# Patient Record
Sex: Female | Born: 1985 | Race: Black or African American | Hispanic: No | Marital: Married | Smoking: Never smoker
Health system: Southern US, Community
[De-identification: ages and names within clinical notes are randomized; demographics above are authoritative.]

## PROBLEM LIST (undated history)

## (undated) DIAGNOSIS — Z789 Other specified health status: Secondary | ICD-10-CM

## (undated) DIAGNOSIS — O24419 Gestational diabetes mellitus in pregnancy, unspecified control: Secondary | ICD-10-CM

## (undated) HISTORY — PX: NO PAST SURGERIES: SHX2092

## (undated) HISTORY — DX: Gestational diabetes mellitus in pregnancy, unspecified control: O24.419

---
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
## 2010-03-01 ENCOUNTER — Ambulatory Visit (HOSPITAL_COMMUNITY)
Admission: RE | Admit: 2010-03-01 | Discharge: 2010-03-01 | Payer: Self-pay | Source: Home / Self Care | Attending: Obstetrics | Admitting: Obstetrics

## 2010-04-03 ENCOUNTER — Other Ambulatory Visit (HOSPITAL_COMMUNITY): Payer: Self-pay | Admitting: Obstetrics

## 2010-04-03 DIAGNOSIS — Z3400 Encounter for supervision of normal first pregnancy, unspecified trimester: Secondary | ICD-10-CM

## 2010-04-19 ENCOUNTER — Ambulatory Visit (HOSPITAL_COMMUNITY)
Admission: RE | Admit: 2010-04-19 | Discharge: 2010-04-19 | Disposition: A | Discharge status: Home / Self Care | Payer: Managed Care, Other (non HMO) | Source: Ambulatory Visit | Attending: Obstetrics | Admitting: Obstetrics

## 2010-04-19 DIAGNOSIS — Z1389 Encounter for screening for other disorder: Secondary | ICD-10-CM | POA: Insufficient documentation

## 2010-04-19 DIAGNOSIS — Z3400 Encounter for supervision of normal first pregnancy, unspecified trimester: Secondary | ICD-10-CM

## 2010-04-19 DIAGNOSIS — O358XX0 Maternal care for other (suspected) fetal abnormality and damage, not applicable or unspecified: Secondary | ICD-10-CM | POA: Insufficient documentation

## 2010-04-19 DIAGNOSIS — Z363 Encounter for antenatal screening for malformations: Secondary | ICD-10-CM | POA: Insufficient documentation

## 2010-06-13 ENCOUNTER — Other Ambulatory Visit (HOSPITAL_COMMUNITY): Payer: Self-pay | Admitting: Obstetrics

## 2010-06-13 DIAGNOSIS — Z09 Encounter for follow-up examination after completed treatment for conditions other than malignant neoplasm: Secondary | ICD-10-CM

## 2010-06-17 ENCOUNTER — Other Ambulatory Visit (HOSPITAL_COMMUNITY): Payer: Self-pay | Admitting: Obstetrics

## 2010-06-17 ENCOUNTER — Ambulatory Visit (HOSPITAL_COMMUNITY)
Admission: RE | Admit: 2010-06-17 | Discharge: 2010-06-17 | Disposition: A | Discharge status: Home / Self Care | Payer: Managed Care, Other (non HMO) | Source: Ambulatory Visit | Attending: Obstetrics | Admitting: Obstetrics

## 2010-06-17 DIAGNOSIS — O365990 Maternal care for other known or suspected poor fetal growth, unspecified trimester, not applicable or unspecified: Secondary | ICD-10-CM | POA: Insufficient documentation

## 2010-06-17 DIAGNOSIS — Z09 Encounter for follow-up examination after completed treatment for conditions other than malignant neoplasm: Secondary | ICD-10-CM

## 2010-06-17 DIAGNOSIS — Z3689 Encounter for other specified antenatal screening: Secondary | ICD-10-CM | POA: Insufficient documentation

## 2010-06-20 ENCOUNTER — Other Ambulatory Visit (HOSPITAL_COMMUNITY): Payer: Self-pay | Admitting: Obstetrics

## 2010-06-20 DIAGNOSIS — Z1389 Encounter for screening for other disorder: Secondary | ICD-10-CM

## 2010-07-18 ENCOUNTER — Ambulatory Visit (HOSPITAL_COMMUNITY): Payer: Managed Care, Other (non HMO)

## 2010-08-15 ENCOUNTER — Inpatient Hospital Stay (HOSPITAL_COMMUNITY)
Admission: AD | Admit: 2010-08-15 | Discharge: 2010-08-17 | DRG: 775 | Disposition: A | Discharge status: Home / Self Care | Payer: Managed Care, Other (non HMO) | Source: Ambulatory Visit | Attending: Obstetrics | Admitting: Obstetrics

## 2010-08-15 ENCOUNTER — Institutional Professional Consult (permissible substitution): Payer: Managed Care, Other (non HMO) | Admitting: Pediatrics

## 2010-08-15 DIAGNOSIS — O4290 Premature rupture of membranes, unspecified as to length of time between rupture and onset of labor, unspecified weeks of gestation: Principal | ICD-10-CM | POA: Diagnosis present

## 2010-08-15 LAB — CBC
HCT: 34.8 % — ABNORMAL LOW (ref 36.0–46.0)
Hemoglobin: 11.7 g/dL — ABNORMAL LOW (ref 12.0–15.0)
MCH: 29.4 pg (ref 26.0–34.0)
MCV: 87.4 fL (ref 78.0–100.0)
RBC: 3.98 MIL/uL (ref 3.87–5.11)

## 2010-08-15 LAB — RPR: RPR Ser Ql: NONREACTIVE

## 2010-08-16 LAB — CBC
HCT: 32.3 % — ABNORMAL LOW (ref 36.0–46.0)
Hemoglobin: 10.6 g/dL — ABNORMAL LOW (ref 12.0–15.0)
MCHC: 32.8 g/dL (ref 30.0–36.0)
MCV: 87.5 fL (ref 78.0–100.0)

## 2010-08-16 MED ORDER — HYDROCORTISONE ACE-PRAMOXINE 1-1 % RE FOAM: 1.0000 | RECTAL | Status: DC | PRN

## 2010-08-16 MED ORDER — MENTHOL 3 MG MT LOZG: 1.0000 | LOZENGE | OROMUCOSAL | Status: DC | PRN

## 2010-08-16 MED ORDER — SODIUM CHLORIDE 0.9 % IJ SOLN
3.0000 mL | INTRAMUSCULAR | Status: DC | PRN
  Filled 2010-08-16: qty 3

## 2010-08-16 MED ORDER — DIPHENHYDRAMINE HCL 25 MG PO CAPS: 25.0000 mg | ORAL_CAPSULE | Freq: Four times a day (QID) | ORAL | Status: DC | PRN

## 2010-08-16 MED ORDER — GUAIFENESIN 100 MG/5ML PO SOLN: 15.0000 mL | ORAL | Status: DC | PRN

## 2010-08-16 MED ORDER — OXYCODONE-ACETAMINOPHEN 5-325 MG PO TABS: 1.0000 | ORAL_TABLET | ORAL | Status: DC | PRN

## 2010-08-16 MED ORDER — DIBUCAINE 1 % RE OINT: TOPICAL_OINTMENT | RECTAL | Status: DC | PRN

## 2010-08-16 MED ORDER — FLEET ENEMA 7-19 GM/118ML RE ENEM
1.0000 | ENEMA | Freq: Every day | RECTAL | Status: DC | PRN
  Filled 2010-08-16: qty 1

## 2010-08-16 MED ORDER — ZOLPIDEM TARTRATE 5 MG PO TABS: 5.0000 mg | ORAL_TABLET | Freq: Every evening | ORAL | Status: DC | PRN

## 2010-08-16 MED ORDER — ONDANSETRON HCL 4 MG PO TABS: 4.0000 mg | ORAL_TABLET | ORAL | Status: DC | PRN

## 2010-08-16 MED ORDER — WITCH HAZEL-GLYCERIN EX PADS
MEDICATED_PAD | CUTANEOUS | Status: DC | PRN
  Filled 2010-08-16: qty 100

## 2010-08-16 MED ORDER — SENNOSIDES-DOCUSATE SODIUM 8.6-50 MG PO TABS
1.0000 | ORAL_TABLET | Freq: Every day | ORAL | Status: DC
  Filled 2010-08-16: qty 2

## 2010-08-16 MED ORDER — BENZOCAINE-MENTHOL 20-0.5 % EX AERO: 1.0000 "application " | INHALATION_SPRAY | Freq: Four times a day (QID) | CUTANEOUS | Status: DC | PRN

## 2010-08-16 MED ORDER — OXYTOCIN 20 UNITS IN LACTATED RINGERS INFUSION - SIMPLE: 125.0000 mL/h | INTRAVENOUS | Status: DC

## 2010-08-16 MED ORDER — CALCIUM CARBONATE ANTACID 500 MG PO CHEW: 1.0000 | CHEWABLE_TABLET | ORAL | Status: DC | PRN

## 2010-08-16 MED ORDER — ONDANSETRON HCL 4 MG/2ML IJ SOLN: 4.0000 mg | INTRAMUSCULAR | Status: DC | PRN

## 2010-08-16 MED ORDER — BISACODYL 10 MG RE SUPP: 10.0000 mg | Freq: Every day | RECTAL | Status: DC | PRN

## 2010-08-16 MED ORDER — SIMETHICONE 80 MG PO CHEW
80.0000 mg | CHEWABLE_TABLET | ORAL | Status: DC | PRN
  Filled 2010-08-16: qty 1

## 2010-08-16 MED ORDER — PRENATAL PLUS 27-1 MG PO TABS
1.0000 | ORAL_TABLET | Freq: Every day | ORAL | Status: DC
Start: 2010-08-17 — End: 2010-08-17
  Filled 2010-08-16: qty 1

## 2010-08-16 MED ORDER — MAGNESIUM HYDROXIDE 400 MG/5ML PO SUSP: 30.0000 mL | ORAL | Status: DC

## 2010-08-16 MED ORDER — IBUPROFEN 600 MG PO TABS
600.0000 mg | ORAL_TABLET | Freq: Four times a day (QID) | ORAL | Status: DC
  Administered 2010-08-17: 600 mg via ORAL

## 2010-08-16 MED ORDER — SODIUM CHLORIDE 0.9 % IJ SOLN
3.0000 mL | Freq: Two times a day (BID) | INTRAMUSCULAR | Status: DC
  Filled 2010-08-16 (×3): qty 3

## 2010-08-16 MED ORDER — PSEUDOEPHEDRINE HCL 30 MG PO TABS: 60.0000 mg | ORAL_TABLET | ORAL | Status: DC | PRN

## 2010-08-17 ENCOUNTER — Encounter (HOSPITAL_COMMUNITY): Payer: Self-pay | Admitting: Obstetrics

## 2010-08-17 MED ORDER — BENZOCAINE-MENTHOL 20-0.5 % EX AERO: 1.0000 "application " | INHALATION_SPRAY | Freq: Four times a day (QID) | CUTANEOUS | Status: AC | PRN

## 2010-08-17 MED ORDER — ACETAMINOPHEN-CODEINE 300-30 MG PO TABS: 1.0000 | ORAL_TABLET | ORAL | Status: AC | PRN

## 2010-08-17 MED ORDER — BISACODYL 10 MG RE SUPP
10.0000 mg | Freq: Every day | RECTAL | Status: AC | PRN
Start: 2010-08-17 — End: 2010-08-27

## 2010-08-17 NOTE — Progress Notes (Deleted)
  Obstetric Discharge Summary  Reason for Admission: onset of labor  Prenatal Procedures: none  Intrapartum Procedures: spontaneous vaginal delivery  Postpartum Procedures: none  Complications-Operative and Postpartum: none  Hemoglobin   Date  Value  Range  Status   08/16/2010  10.6*  12.0-15.0 (g/dL)  Final      HCT   Date  Value  Range  Status   08/16/2010  32.3*  36.0-46.0 (%)  Final    Discharge Diagnoses: Term Pregnancy-delivered  Discharge Information:  Date: 08/17/2010  Activity: pelvic rest  Diet: routine  Medications: Tylenol #3  Condition: stable  Instructions: refer to practice specific booklet  Discharge to: home  Follow-up Information    Follow up with Isaiha Asare A. Call in 6 weeks.    Contact information:    [address]  [address]  [place] [address]  [phone]         Newborn Data: Live born  Information for the patient's newborn:  Batt, Girl Remee [[id number]]   female  ; APGAR , ; weight ;  Home with mother.  Chrislynn Mosely A  08/17/2010, 7:45 AM  

## 2010-08-17 NOTE — Discharge Summary (Signed)
  Obstetric Discharge Summary  Reason for Admission: onset of labor  Prenatal Procedures: none  Intrapartum Procedures: spontaneous vaginal delivery  Postpartum Procedures: none  Complications-Operative and Postpartum: none  Hemoglobin   Date  Value  Range  Status   08/16/2010  10.6*  12.0-15.0 (g/dL)  Final      HCT   Date  Value  Range  Status   08/16/2010  32.3*  36.0-46.0 (%)  Final    Discharge Diagnoses: Term Pregnancy-delivered  Discharge Information:  Date: 08/17/2010  Activity: pelvic rest  Diet: routine  Medications: Tylenol #3  Condition: stable  Instructions: refer to practice specific booklet  Discharge to: home  Follow-up Information    Follow up with Mikale Silversmith A. Call in 6 weeks.    Contact information:    [address]  [address]  [place] [address]  [phone]         Newborn Data: Live born  Information for the patient's newborn:  Katanya, Schlie [[id number]]   female  ; APGAR , ; weight ;  Home with mother.  Layah Skousen A  08/17/2010, 7:45 AM

## 2010-08-25 NOTE — Discharge Summary (Signed)
Obstetric Discharge Summary Reason for Admission: onset of labor Prenatal Procedures: none Intrapartum Procedures: spontaneous vaginal delivery Postpartum Procedures: none Complications-Operative and Postpartum: none  Hemoglobin  Date Value Range Status  08/16/2010 10.6* 12.0-15.0 (g/dL) Final     HCT  Date Value Range Status  08/16/2010 32.3* 36.0-46.0 (%) Final    Discharge Diagnoses: Term Pregnancy-delivered  Discharge Information: Date: 08/25/2010 Activity: pelvic rest Diet: routine Medications: Tylenol #3 Condition: stable Instructions: refer to practice specific booklet Discharge to: home Follow-up Information    Follow up with [name] Call in 6 weeks.   Contact information:   [address] [place] [address] [phone]          Newborn Data: Live born  Information for the patient's newborn:  [name], [name] [[id number]]  female ; APGAR , ; weight ;  Home with mother.  [name] 08/25/2010, 2:58 AM

## 2010-09-01 ENCOUNTER — Inpatient Hospital Stay (HOSPITAL_COMMUNITY)
Admission: AD | Admit: 2010-09-01 | Payer: Managed Care, Other (non HMO) | Source: Ambulatory Visit | Admitting: Obstetrics

## 2011-05-11 LAB — OB RESULTS CONSOLE GBS: GBS: NEGATIVE

## 2011-07-14 ENCOUNTER — Inpatient Hospital Stay (HOSPITAL_COMMUNITY)
Admission: AD | Admit: 2011-07-14 | Discharge: 2011-07-14 | Disposition: A | Discharge status: Hospice | Payer: BC Managed Care – PPO | Source: Ambulatory Visit | Attending: Obstetrics | Admitting: Obstetrics

## 2011-07-14 DIAGNOSIS — N390 Urinary tract infection, site not specified: Secondary | ICD-10-CM

## 2011-07-14 DIAGNOSIS — R350 Frequency of micturition: Secondary | ICD-10-CM | POA: Insufficient documentation

## 2011-07-14 DIAGNOSIS — R300 Dysuria: Secondary | ICD-10-CM | POA: Insufficient documentation

## 2011-07-14 LAB — URINALYSIS, ROUTINE W REFLEX MICROSCOPIC
Bilirubin Urine: NEGATIVE
Glucose, UA: NEGATIVE mg/dL
Ketones, ur: NEGATIVE mg/dL
Nitrite: NEGATIVE
Protein, ur: 100 mg/dL — AB
Specific Gravity, Urine: 1.015 (ref 1.005–1.030)
Urobilinogen, UA: 0.2 mg/dL (ref 0.0–1.0)
pH: 5.5 (ref 5.0–8.0)

## 2011-07-14 LAB — URINE MICROSCOPIC-ADD ON

## 2011-07-14 MED ORDER — SULFAMETHOXAZOLE-TRIMETHOPRIM 800-160 MG PO TABS: 1.0000 | ORAL_TABLET | Freq: Two times a day (BID) | ORAL | Status: DC

## 2011-07-14 MED ORDER — FLUCONAZOLE 150 MG PO TABS: 150.0000 mg | ORAL_TABLET | Freq: Once | ORAL | Status: AC

## 2011-07-14 MED ORDER — SULFAMETHOXAZOLE-TRIMETHOPRIM 800-160 MG PO TABS: 1.0000 | ORAL_TABLET | Freq: Two times a day (BID) | ORAL | Status: AC

## 2011-07-14 MED ORDER — FLUCONAZOLE 150 MG PO TABS: 150.0000 mg | ORAL_TABLET | Freq: Once | ORAL | Status: DC

## 2011-07-14 NOTE — MAU Note (Signed)
Pt reports pain, burning and frequency with urination that started Thursday, self medicated with AZO.  Today woke up with shakes and dizziness.  LMP 07/01/2011.  G1 P1

## 2011-07-14 NOTE — Discharge Instructions (Signed)

## 2011-07-14 NOTE — MAU Provider Note (Signed)
History     CSN: [id number]  Arrival date and time: 07/14/11 2134   None     Chief Complaint  Patient presents with  . Urinary Tract Infection  . Shaking  . Dizziness   HPI 26 year old female with onset of frequency and dysuria that started last Thursday. Symptoms have been getting worse over the past couple of days. Patient has tried over-the-counter remedies including cranberry juice, water, which were mildly helpful.  no provoking factors.   OB History    Grav Para Term Preterm Abortions TAB SAB Ect Mult Living                  Past Medical History  Diagnosis Date  . Spontaneous vaginal delivery 08/17/2010    No past surgical history on file.  No family history on file.  History  Substance Use Topics  . Smoking status: Not on file  . Smokeless tobacco: Not on file  . Alcohol Use: Not on file    Allergies: No Known Allergies  Prescriptions prior to admission  Medication Sig Dispense Refill  . Acetaminophen-Codeine (TYLENOL/CODEINE #3) 300-30 MG per tablet Take 1 tablet by mouth every 4 (four) hours as needed for pain.  40 tablet  0  . simethicone (MYLICON) 80 MG chewable tablet Chew 80 mg by mouth every 6 (six) hours as needed.        Review of Systems  Constitutional: Negative for fever and chills.  Gastrointestinal: Negative for nausea, vomiting, diarrhea and constipation.   Physical Exam   Blood pressure 113/70, pulse 140, temperature 102.3 F (39.1 C), temperature source Oral, resp. rate 18, height 5\' 2"  (1.575 m), weight 53.252 kg (117 lb 6.4 oz), last menstrual period 07/01/2011, SpO2 98.00%.  Physical Exam  Constitutional: She is oriented to person, place, and time. She appears well-developed.  HENT:  Head: Normocephalic and atraumatic.  GI: Soft. She exhibits no distension and no mass. There is tenderness (Suprapubic). There is no rebound and no guarding.  Musculoskeletal:       No CVA tenderness  Neurological: She is alert and oriented to  person, place, and time.  Skin: Skin is warm and dry.  Psychiatric: She has a normal mood and affect. Her behavior is normal. Judgment and thought content normal.   Results for orders placed during the hospital encounter of 07/14/11 (from the past 24 hour(s))  URINALYSIS, ROUTINE W REFLEX MICROSCOPIC     Status: Abnormal   Collection Time   07/14/11  9:52 PM      Component Value Range   Color, Urine YELLOW  YELLOW    APPearance CLEAR  CLEAR    Specific Gravity, Urine 1.015  1.005 - 1.030    pH 5.5  5.0 - 8.0    Glucose, UA NEGATIVE  NEGATIVE (mg/dL)   Hgb urine dipstick LARGE (*) NEGATIVE    Bilirubin Urine NEGATIVE  NEGATIVE    Ketones, ur NEGATIVE  NEGATIVE (mg/dL)   Protein, ur 409 (*) NEGATIVE (mg/dL)   Urobilinogen, UA 0.2  0.0 - 1.0 (mg/dL)   Nitrite NEGATIVE  NEGATIVE    Leukocytes, UA LARGE (*) NEGATIVE   URINE MICROSCOPIC-ADD ON     Status: Abnormal   Collection Time   07/14/11  9:52 PM      Component Value Range   Squamous Epithelial / LPF FEW (*) RARE    WBC, UA TOO NUMEROUS TO COUNT  <3 (WBC/hpf)   RBC / HPF 3-6  <3 (RBC/hpf)   Bacteria,  UA FEW (*) RARE    Urine-Other MUCOUS PRESENT    POCT PREGNANCY, URINE     Status: Normal   Collection Time   07/14/11 10:01 PM      Component Value Range   Preg Test, Ur NEGATIVE  NEGATIVE      MAU Course  Procedures  MDM No evidence of pyelonephritis.  Assessment and Plan  #1 urinary tract infection  Bactrim twice a day for 3 days. Diflucan for yeast infection prophylaxis. Patient to followup with PCP if symptoms worsen  Avalee Castrellon JEHIEL 07/14/2011, 10:34 PM

## 2011-07-14 NOTE — MAU Note (Signed)
Pt started with lower abd pain on May 30th, pt self medicated with AZO for two days with minimal relief.  Yesterday pt started with fever and it worsened today 102.3.  Mild left flank pain, nontender unless palpated.

## 2011-10-28 LAB — OB RESULTS CONSOLE HEPATITIS B SURFACE ANTIGEN: Hepatitis B Surface Ag: NEGATIVE

## 2011-10-28 LAB — OB RESULTS CONSOLE ABO/RH: RH Type: POSITIVE

## 2011-10-28 LAB — OB RESULTS CONSOLE RUBELLA ANTIBODY, IGM: Rubella: IMMUNE

## 2011-10-28 LAB — OB RESULTS CONSOLE ANTIBODY SCREEN: Antibody Screen: NEGATIVE

## 2011-10-28 LAB — OB RESULTS CONSOLE HIV ANTIBODY (ROUTINE TESTING): HIV: NONREACTIVE

## 2011-11-21 LAB — OB RESULTS CONSOLE GC/CHLAMYDIA
Chlamydia: NEGATIVE
Gonorrhea: NEGATIVE

## 2012-01-25 LAB — OB RESULTS CONSOLE GC/CHLAMYDIA: Chlamydia: NEGATIVE

## 2012-01-25 LAB — OB RESULTS CONSOLE ANTIBODY SCREEN: Antibody Screen: NEGATIVE

## 2012-02-11 NOTE — L&D Delivery Note (Signed)
Delivery Note At 6:21 AM a viable female was delivered via Vaginal, Spontaneous Delivery Apgars pending  Placenta status: spontaneously with intact cord   Cord:  with the following complications: none  Anesthesia: Epidural  Episiotomy: none  Lacerationsfirst:  Suture Repair: 3.0 chromic Est. Blood Loss (mL): 300  Mom to postpartum.  Baby to nursery-stable.  Lyndon Chenoweth L 05/18/2012, 6:27 AM

## 2012-05-18 ENCOUNTER — Encounter (HOSPITAL_COMMUNITY): Payer: Self-pay | Admitting: Anesthesiology

## 2012-05-18 ENCOUNTER — Encounter (HOSPITAL_COMMUNITY): Payer: Self-pay | Admitting: *Deleted

## 2012-05-18 ENCOUNTER — Inpatient Hospital Stay (HOSPITAL_COMMUNITY): Payer: BC Managed Care – PPO | Admitting: Anesthesiology

## 2012-05-18 ENCOUNTER — Inpatient Hospital Stay (HOSPITAL_COMMUNITY)
Admission: AD | Admit: 2012-05-18 | Discharge: 2012-05-20 | DRG: 373 | Disposition: A | Discharge status: Home / Self Care | Payer: BC Managed Care – PPO | Source: Ambulatory Visit | Attending: Obstetrics and Gynecology | Admitting: Obstetrics and Gynecology

## 2012-05-18 HISTORY — DX: Other specified health status: Z78.9

## 2012-05-18 LAB — CBC
MCHC: 34.3 g/dL (ref 30.0–36.0)
MCV: 87.4 fL (ref 78.0–100.0)
Platelets: 178 10*3/uL (ref 150–400)
RDW: 13.7 % (ref 11.5–15.5)
WBC: 9.1 10*3/uL (ref 4.0–10.5)

## 2012-05-18 MED ORDER — PRENATAL MULTIVITAMIN CH
1.0000 | ORAL_TABLET | Freq: Every day | ORAL | Status: DC
  Administered 2012-05-18 – 2012-05-19 (×2): 1 via ORAL
  Filled 2012-05-18 (×2): qty 1

## 2012-05-18 MED ORDER — IBUPROFEN 600 MG PO TABS
600.0000 mg | ORAL_TABLET | Freq: Four times a day (QID) | ORAL | Status: DC
  Administered 2012-05-18 – 2012-05-20 (×8): 600 mg via ORAL
  Filled 2012-05-18 (×8): qty 1

## 2012-05-18 MED ORDER — OXYCODONE-ACETAMINOPHEN 5-325 MG PO TABS: 1.0000 | ORAL_TABLET | ORAL | Status: DC | PRN

## 2012-05-18 MED ORDER — LACTATED RINGERS IV SOLN: INTRAVENOUS | Status: DC

## 2012-05-18 MED ORDER — BISACODYL 10 MG RE SUPP: 10.0000 mg | Freq: Every day | RECTAL | Status: DC | PRN

## 2012-05-18 MED ORDER — SIMETHICONE 80 MG PO CHEW: 80.0000 mg | CHEWABLE_TABLET | ORAL | Status: DC | PRN

## 2012-05-18 MED ORDER — LANOLIN HYDROUS EX OINT: TOPICAL_OINTMENT | CUTANEOUS | Status: DC | PRN

## 2012-05-18 MED ORDER — LIDOCAINE HCL (PF) 1 % IJ SOLN
30.0000 mL | INTRAMUSCULAR | Status: DC | PRN
  Filled 2012-05-18 (×2): qty 30

## 2012-05-18 MED ORDER — ONDANSETRON HCL 4 MG/2ML IJ SOLN
4.0000 mg | Freq: Four times a day (QID) | INTRAMUSCULAR | Status: DC | PRN
  Administered 2012-05-18: 4 mg via INTRAVENOUS
  Filled 2012-05-18: qty 2

## 2012-05-18 MED ORDER — IBUPROFEN 600 MG PO TABS: 600.0000 mg | ORAL_TABLET | Freq: Four times a day (QID) | ORAL | Status: DC | PRN

## 2012-05-18 MED ORDER — ACETAMINOPHEN 325 MG PO TABS: 650.0000 mg | ORAL_TABLET | ORAL | Status: DC | PRN

## 2012-05-18 MED ORDER — FLEET ENEMA 7-19 GM/118ML RE ENEM: 1.0000 | ENEMA | RECTAL | Status: DC | PRN

## 2012-05-18 MED ORDER — FLEET ENEMA 7-19 GM/118ML RE ENEM: 1.0000 | ENEMA | Freq: Every day | RECTAL | Status: DC | PRN

## 2012-05-18 MED ORDER — TETANUS-DIPHTH-ACELL PERTUSSIS 5-2.5-18.5 LF-MCG/0.5 IM SUSP: 0.5000 mL | Freq: Once | INTRAMUSCULAR | Status: DC

## 2012-05-18 MED ORDER — FENTANYL 2.5 MCG/ML BUPIVACAINE 1/10 % EPIDURAL INFUSION (WH - ANES)
14.0000 mL/h | INTRAMUSCULAR | Status: DC | PRN
  Administered 2012-05-18: 14 mL/h via EPIDURAL
  Filled 2012-05-18: qty 125

## 2012-05-18 MED ORDER — WITCH HAZEL-GLYCERIN EX PADS: 1.0000 "application " | MEDICATED_PAD | CUTANEOUS | Status: DC | PRN

## 2012-05-18 MED ORDER — CITRIC ACID-SODIUM CITRATE 334-500 MG/5ML PO SOLN: 30.0000 mL | ORAL | Status: DC | PRN

## 2012-05-18 MED ORDER — SENNOSIDES-DOCUSATE SODIUM 8.6-50 MG PO TABS
2.0000 | ORAL_TABLET | Freq: Every day | ORAL | Status: DC
  Administered 2012-05-18 – 2012-05-19 (×2): 2 via ORAL

## 2012-05-18 MED ORDER — PHENYLEPHRINE 40 MCG/ML (10ML) SYRINGE FOR IV PUSH (FOR BLOOD PRESSURE SUPPORT)
80.0000 ug | PREFILLED_SYRINGE | INTRAVENOUS | Status: DC | PRN
  Filled 2012-05-18: qty 2

## 2012-05-18 MED ORDER — MEDROXYPROGESTERONE ACETATE 150 MG/ML IM SUSP: 150.0000 mg | INTRAMUSCULAR | Status: DC | PRN

## 2012-05-18 MED ORDER — PHENYLEPHRINE 40 MCG/ML (10ML) SYRINGE FOR IV PUSH (FOR BLOOD PRESSURE SUPPORT)
80.0000 ug | PREFILLED_SYRINGE | INTRAVENOUS | Status: DC | PRN
  Filled 2012-05-18: qty 2
  Filled 2012-05-18: qty 5

## 2012-05-18 MED ORDER — BENZOCAINE-MENTHOL 20-0.5 % EX AERO
1.0000 "application " | INHALATION_SPRAY | CUTANEOUS | Status: DC | PRN
  Administered 2012-05-18: 1 via TOPICAL
  Filled 2012-05-18: qty 56

## 2012-05-18 MED ORDER — ONDANSETRON HCL 4 MG/2ML IJ SOLN: 4.0000 mg | INTRAMUSCULAR | Status: DC | PRN

## 2012-05-18 MED ORDER — LACTATED RINGERS IV SOLN: 500.0000 mL | INTRAVENOUS | Status: DC | PRN

## 2012-05-18 MED ORDER — DIPHENHYDRAMINE HCL 50 MG/ML IJ SOLN: 12.5000 mg | INTRAMUSCULAR | Status: DC | PRN

## 2012-05-18 MED ORDER — DIPHENHYDRAMINE HCL 25 MG PO CAPS
25.0000 mg | ORAL_CAPSULE | Freq: Four times a day (QID) | ORAL | Status: DC | PRN
  Administered 2012-05-18: 25 mg via ORAL
  Filled 2012-05-18: qty 1

## 2012-05-18 MED ORDER — OXYTOCIN BOLUS FROM INFUSION: 500.0000 mL | INTRAVENOUS | Status: DC

## 2012-05-18 MED ORDER — EPHEDRINE 5 MG/ML INJ
10.0000 mg | INTRAVENOUS | Status: DC | PRN
  Filled 2012-05-18: qty 2

## 2012-05-18 MED ORDER — ONDANSETRON HCL 4 MG PO TABS: 4.0000 mg | ORAL_TABLET | ORAL | Status: DC | PRN

## 2012-05-18 MED ORDER — MEASLES, MUMPS & RUBELLA VAC ~~LOC~~ INJ
0.5000 mL | INJECTION | Freq: Once | SUBCUTANEOUS | Status: DC
  Filled 2012-05-18: qty 0.5

## 2012-05-18 MED ORDER — EPHEDRINE 5 MG/ML INJ
10.0000 mg | INTRAVENOUS | Status: DC | PRN
  Filled 2012-05-18: qty 4
  Filled 2012-05-18: qty 2

## 2012-05-18 MED ORDER — ZOLPIDEM TARTRATE 5 MG PO TABS: 5.0000 mg | ORAL_TABLET | Freq: Every evening | ORAL | Status: DC | PRN

## 2012-05-18 MED ORDER — OXYTOCIN 40 UNITS IN LACTATED RINGERS INFUSION - SIMPLE MED
62.5000 mL/h | INTRAVENOUS | Status: DC
  Filled 2012-05-18: qty 1000

## 2012-05-18 MED ORDER — LACTATED RINGERS IV SOLN: 500.0000 mL | Freq: Once | INTRAVENOUS | Status: DC

## 2012-05-18 MED ORDER — DIBUCAINE 1 % RE OINT: 1.0000 "application " | TOPICAL_OINTMENT | RECTAL | Status: DC | PRN

## 2012-05-18 MED ORDER — LIDOCAINE HCL (PF) 1 % IJ SOLN
INTRAMUSCULAR | Status: DC | PRN
  Administered 2012-05-18 (×4): 4 mL

## 2012-05-18 NOTE — Anesthesia Preprocedure Evaluation (Signed)

## 2012-05-18 NOTE — Anesthesia Postprocedure Evaluation (Signed)
  Anesthesia Post-op Note  Patient: Zoe Smith  Procedure(s) Performed: * No procedures listed *  Patient Location: Mother/Baby  Anesthesia Type:Epidural  Level of Consciousness: awake  Airway and Oxygen Therapy: Patient Spontanous Breathing  Post-op Pain: mild  Post-op Assessment: Patient's Cardiovascular Status Stable and Respiratory Function Stable  Post-op Vital Signs: stable  Complications: No apparent anesthesia complications

## 2012-05-18 NOTE — MAU Note (Signed)
PT SAYS   SHE IS G2P1- VAG.  EDC 4-23. HAS BEEN HURTING SINCE 0115.  VE TODAY IN OFFICE- ALMOST 4 CM.  DENIES HSV , MRSA, SROM, BLEEDING.  GBS- NEG.

## 2012-05-18 NOTE — Anesthesia Procedure Notes (Addendum)
Epidural Patient location during procedure: OB Start time: 05/18/2012 4:12 AM  Staffing Performed by: anesthesiologist   Preanesthetic Checklist Completed: patient identified, site marked, surgical consent, pre-op evaluation, timeout performed, IV checked, risks and benefits discussed and monitors and equipment checked  Epidural Patient position: sitting Prep: site prepped and draped and DuraPrep Patient monitoring: continuous pulse ox and blood pressure Approach: midline Injection technique: LOR air  Needle:  Needle type: Tuohy  Needle gauge: 17 G Needle length: 9 cm and 9 Needle insertion depth: 4 cm Catheter type: closed end flexible Catheter size: 19 Gauge Catheter at skin depth: 9 cm Test dose: negative  Assessment Events: blood not aspirated, injection not painful, no injection resistance, negative IV test and no paresthesia  Additional Notes Discussed risk of headache, infection, bleeding, nerve injury and failed or incomplete block.  Patient voices understanding and wishes to proceed.  Epidural placed easily on first attempt.  No paresthesia.  Patient tolerated procedure well with no apparent complications.  Jasmine December, MD Reason for block:procedure for pain

## 2012-05-18 NOTE — H&P (Signed)
27 year old G 2 P 1 at 60 w 6 days presents in active labor. Now status post epidural Feeling pressure.  Afebrile Vital signs stable Fetal heart rate category 1 toco regular ucs General alert and oriented Lung CTAB Car RRR Cervix is 100% C +1 Vertex  arom Clear fluid  IMPRESSION: IUP at 37 w 6 days  PLAN: Anticipate NSVD

## 2012-05-18 NOTE — MAU Note (Signed)
Dr. Vincente Poli notified of pt SVE, FHR and contractions.  Orders rec'd for admission.

## 2012-05-19 LAB — CBC
Platelets: 171 10*3/uL (ref 150–400)
RBC: 3.97 MIL/uL (ref 3.87–5.11)
RDW: 13.6 % (ref 11.5–15.5)
WBC: 13.5 10*3/uL — ABNORMAL HIGH (ref 4.0–10.5)

## 2012-05-19 NOTE — Progress Notes (Signed)
Post Partum Day 1 Subjective: no complaints, up ad lib, voiding, tolerating PO and + flatus  Objective: Blood pressure 90/62, pulse 85, temperature 97.8 F (36.6 C), temperature source Oral, resp. rate 17, height 5\' 1"  (1.549 m), weight 122 lb 6 oz (55.509 kg), last menstrual period 07/01/2011, SpO2 99.00%, unknown if currently breastfeeding.  Physical Exam:  General: alert and cooperative Lochia: appropriate Uterine Fundus: firm Incision: perineum intact DVT Evaluation: No evidence of DVT seen on physical exam. Negative Homan's sign. No cords or calf tenderness. No significant calf/ankle edema.   Recent Labs  05/18/12 0310 05/19/12 0615  HGB 11.7* 11.7*  HCT 34.1* 35.3*    Assessment/Plan: Plan for discharge tomorrow   LOS: 1 day   Zoe Smith G 05/19/2012, 8:01 AM

## 2012-05-20 MED ORDER — IBUPROFEN 600 MG PO TABS: 600.0000 mg | ORAL_TABLET | Freq: Four times a day (QID) | ORAL | Status: DC

## 2012-05-20 NOTE — Discharge Summary (Signed)
Obstetric Discharge Summary Reason for Admission: onset of labor Prenatal Procedures: ultrasound Intrapartum Procedures: spontaneous vaginal delivery Postpartum Procedures: none Complications-Operative and Postpartum: 1 degree perineal laceration Hemoglobin  Date Value Range Status  05/19/2012 11.7* 12.0 - 15.0 g/dL Final     HCT  Date Value Range Status  05/19/2012 35.3* 36.0 - 46.0 % Final    Physical Exam:  General: alert and cooperative Lochia: appropriate Uterine Fundus: firm Incision: perineum intact DVT Evaluation: No evidence of DVT seen on physical exam. Negative Homan's sign. No cords or calf tenderness. No significant calf/ankle edema.  Discharge Diagnoses: Term Pregnancy-delivered  Discharge Information: Date: 05/20/2012 Activity: pelvic rest Diet: routine Medications: PNV and Ibuprofen Condition: stable Instructions: refer to practice specific booklet Discharge to: home   Newborn Data: Live born female  Birth Weight: 6 lb 6.6 oz (2909 g) APGAR: 9, 5  Home with mother.  Melia Hopes G 05/20/2012, 7:57 AM

## 2012-05-20 NOTE — Lactation Note (Signed)
This note was copied from the chart of Zoe Smith. Lactation Consultation Note Mom states br feeding is going very well; states baby is feeding frequently with audible swallows. Mom states right nipple started to get sore yesterday, but with improved latch the soreness is improving. Mom states milk is coming in. Instructed mom and dad in the prevention and treatment of engorgement and sore nipples. Enc mom to call lactation office if she has any concerns, and to attend the BFS[facility].    Patient Name: Zoe Smith [id number] Date: 05/20/2012 Reason for consult: Follow-up assessment   Maternal Data    Feeding    LATCH Score/Interventions                      Lactation Tools Discussed/Used     Consult Status Consult Status: PRN    Lenard Forth 05/20/2012, 10:47 AM

## 2012-06-02 NOTE — Addendum Note (Signed)
Addendum created 06/02/12 0810 by Dana Allan, MD   Modules edited: Anesthesia Events

## 2012-06-11 NOTE — Addendum Note (Signed)
Addendum created 06/11/12 1619 by Dana Allan, MD   Modules edited: Anesthesia Events

## 2012-11-05 IMAGING — US US OB FOLLOW-UP
2 series · 12 of 28 positions shown · non-contrast
Comparison: none

[Series 1: us ob follow up · 3 of 12 slices shown (1 of 2)]
[im 3/12]
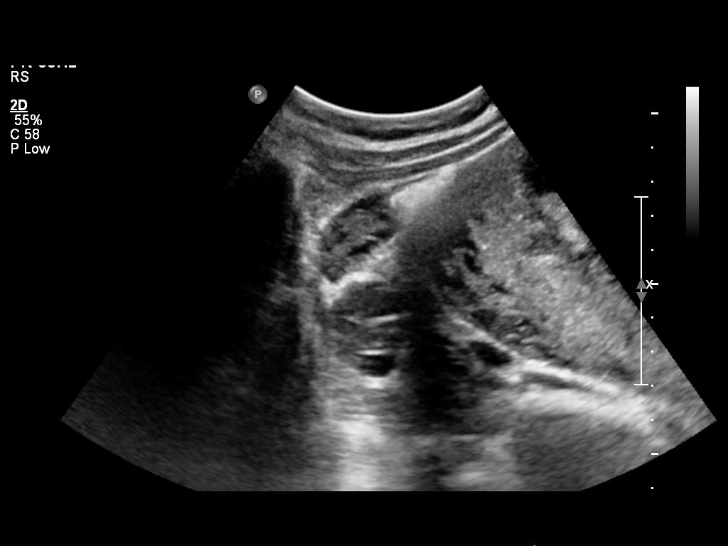
[im 7/12]
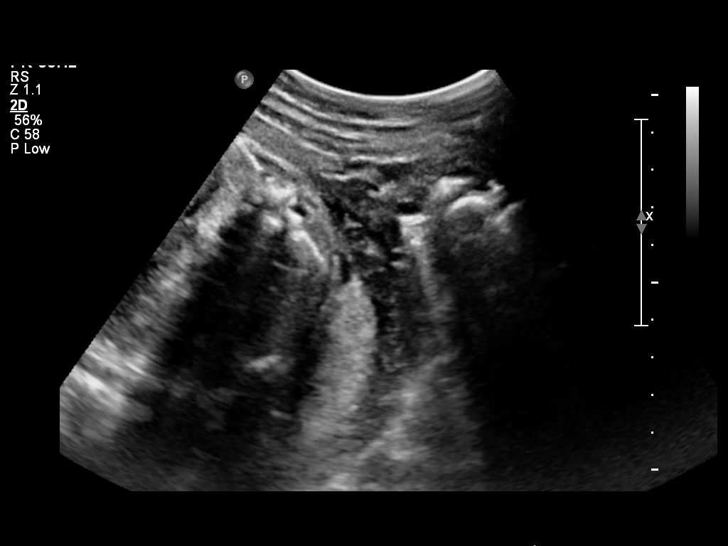
[im 12/12]
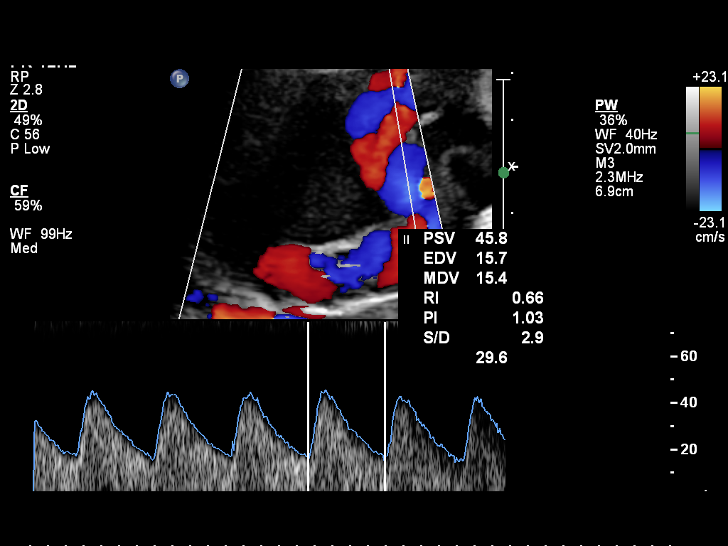

[Series 1: us ob follow up · 9 of 43 slices shown (2 of 2)]
[im 5/43]
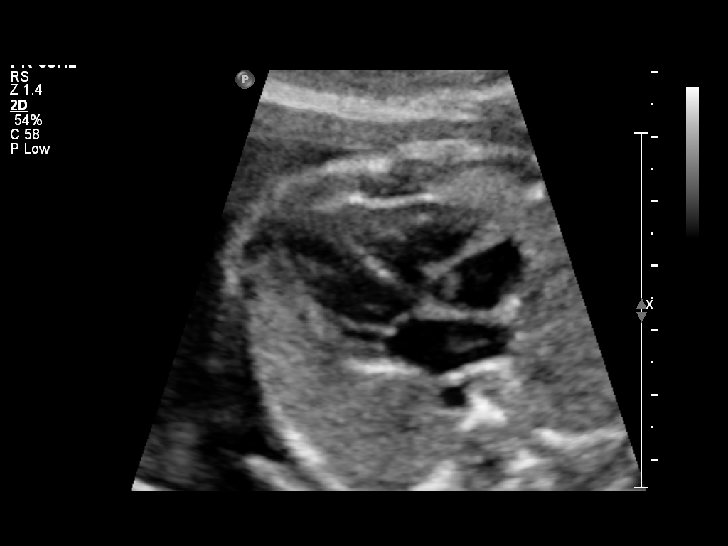
[im 9/43]
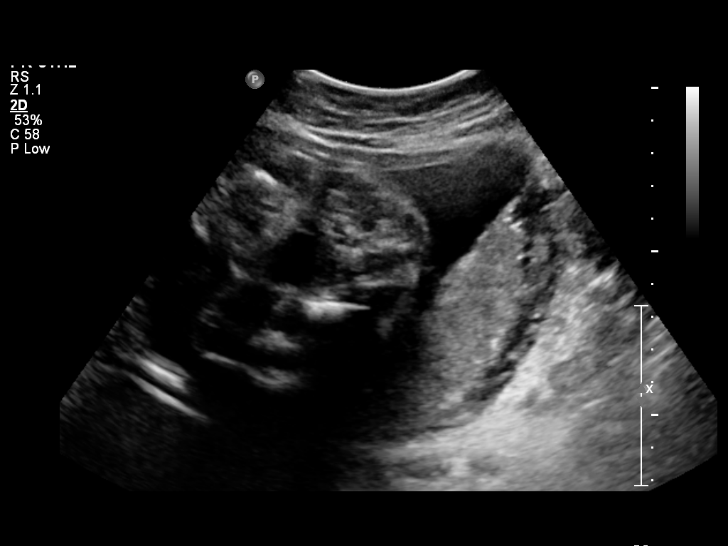
[im 13/43]
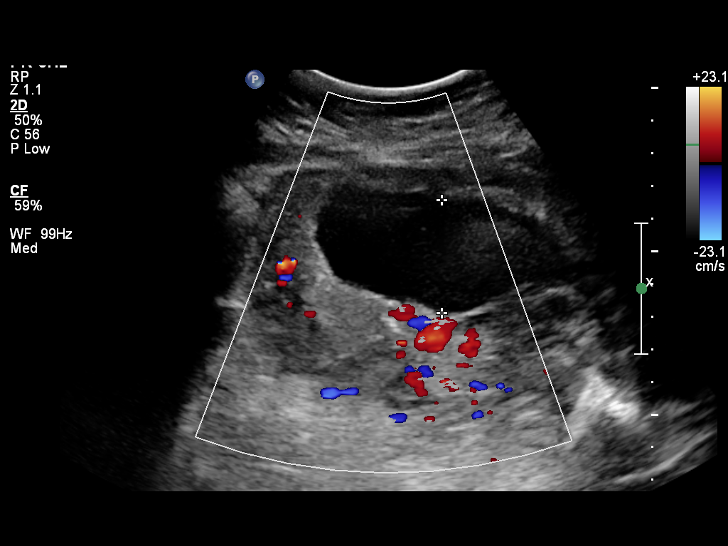
[im 19/43]
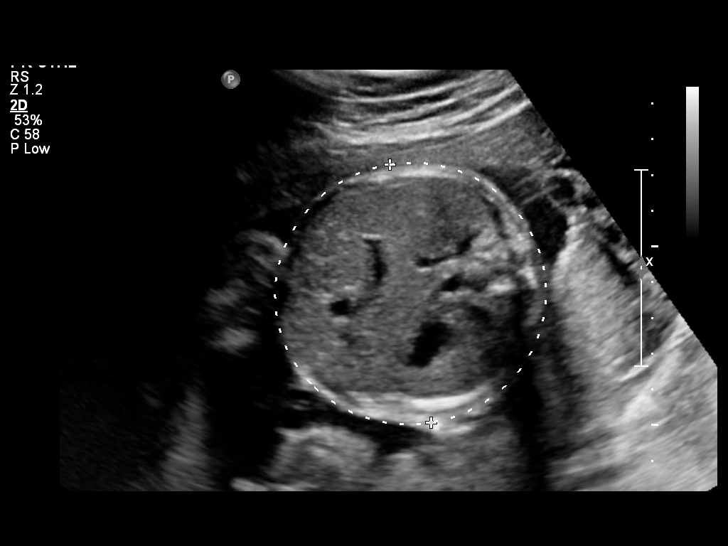
[im 23/43]
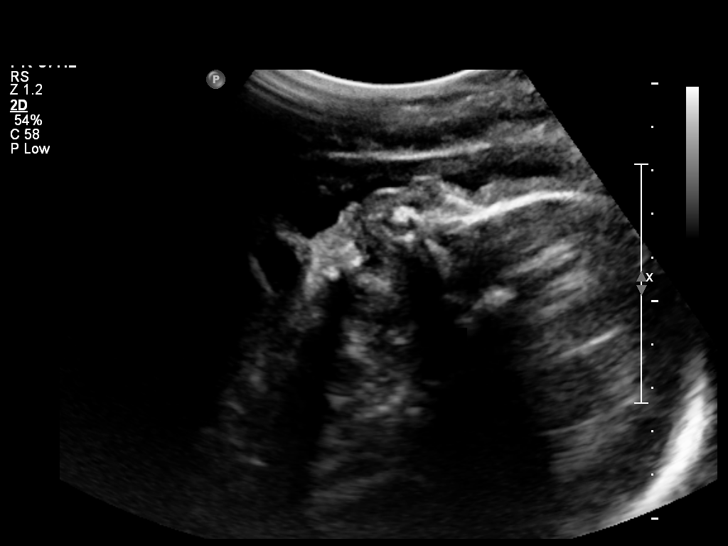
[im 27/43]
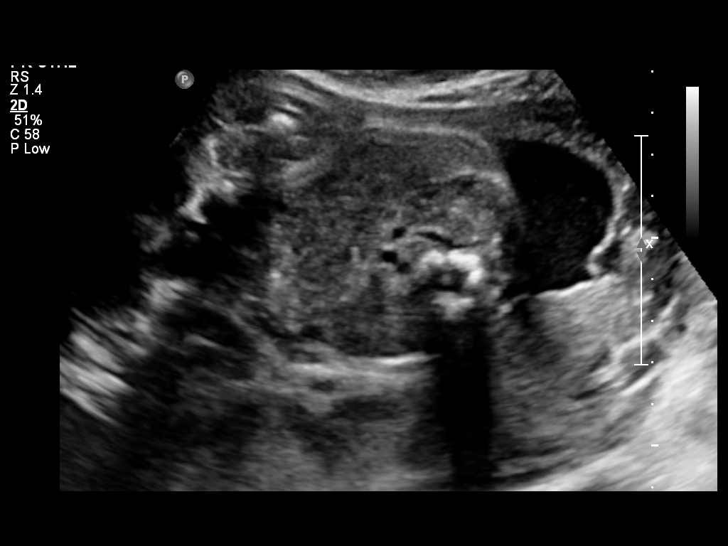
[im 33/43]
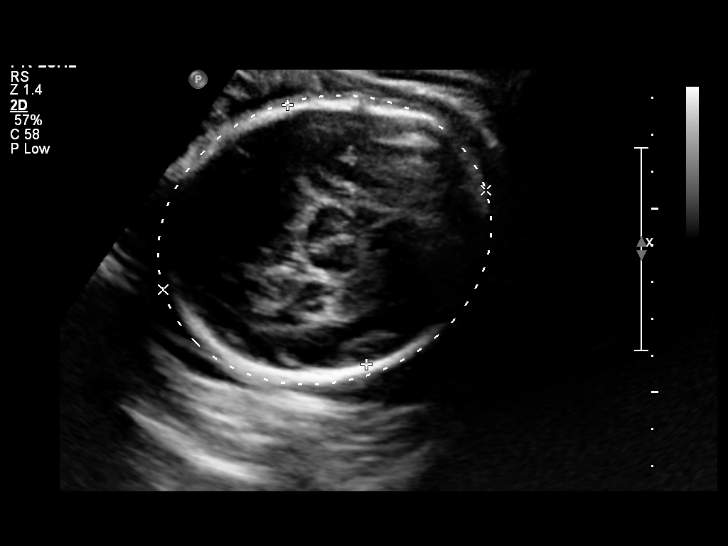
[im 37/43]
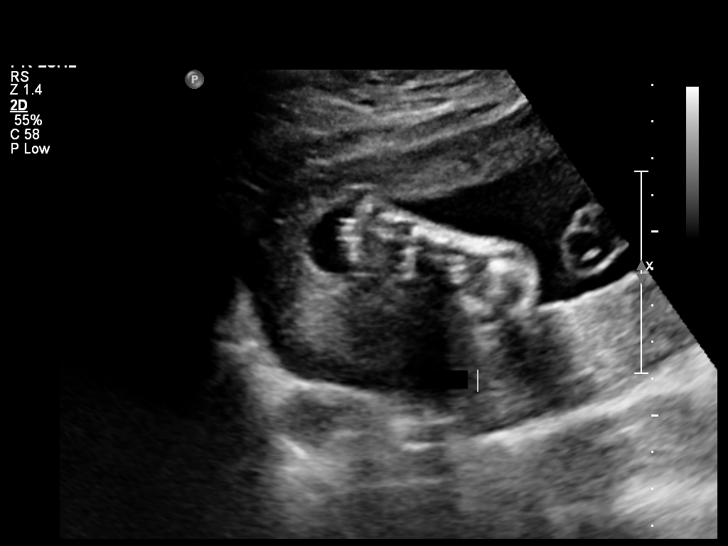
[im 41/43]
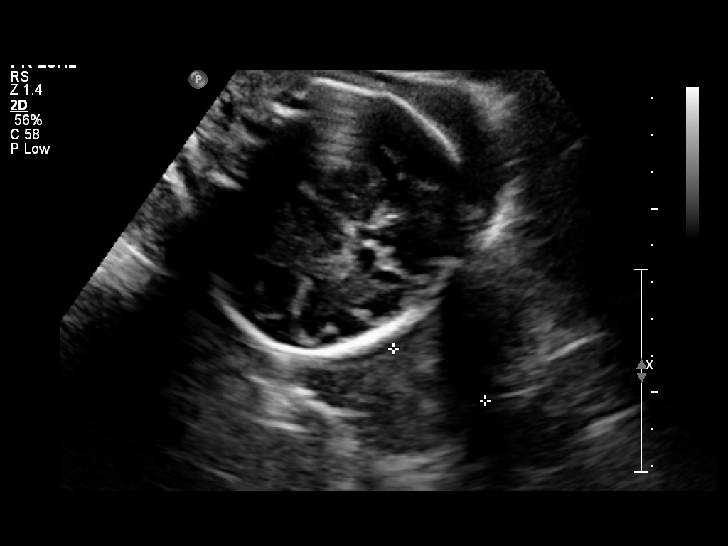

[12 of 28 positions shown; findings below may reference images not displayed]

OBSTETRICS REPORT
                      (Signed Final 06/17/2010 [DATE])

 Order#:         [id number]
Procedures

 US OB FOLLOW UP                                       76816.1
 US UA Cord Doppler                                    76827.2
Indications

 Assess Fetal Growth / Estimated Fetal Weight
 Size less than dates (Small for gestational [AGE]
 FGR)
Fetal Evaluation

 Fetal Heart Rate:  142                          bpm
 Cardiac Activity:  Observed
 Presentation:      Cephalic
 Placenta:          Posterior, above cervical
                    os
 P. Cord            Previously Visualized
 Insertion:

 Amniotic Fluid
 AFI FV:      Subjectively within normal limits
 AFI Sum:     16.04   cm       58  %Tile     Larg Pckt:    5.04  cm
 RUQ:   3.14    cm   RLQ:    3.44   cm    LUQ:   5.04    cm   LLQ:    4.42   cm
Biometry

 BPD:     72.9  mm     G. Age:  29w 2d                CI:        76.64   70 - 86
                                                      FL/HC:      19.9   19.6 -

 HC:     263.8  mm     G. Age:  28w 5d       10  %    HC/AC:      1.13   0.99 -

 AC:     234.4  mm     G. Age:  27w 5d       10  %    FL/BPD:     72.0   71 - 87
 FL:      52.5  mm     G. Age:  28w 0d       10  %    FL/AC:      22.4   20 - 24

 Est. FW:    3343  gm      2 lb 9 oz     30  %
Gestational Age

 U/S Today:     28w 3d                                        EDD:   09/06/10
 Best:          29w 1d     Det. By:  U/S (03/01/10)           EDD:   09/01/10
Anatomy
 Cranium:           Appears normal      Aortic Arch:       Previously seen
 Fetal Cavum:       Previously seen     Ductal Arch:       Previously seen
 Ventricles:        Appears normal      Diaphragm:         Appears normal
 Choroid Plexus:    Previously seen     Stomach:           Appears
                                                           normal, left
                                                           sided
 Cerebellum:        Previously seen     Abdomen:           Appears normal
 Posterior Fossa:   Previously seen     Abdominal Wall:    Previously seen
 Nuchal Fold:       Not applicable      Cord Vessels:      Previously seen
                    (>20 wks GA)
 Face:              Previously seen     Kidneys:           Appear normal
 Heart:             Appears normal      Bladder:           Appears normal
                    (4 chamber &
                    axis)
 RVOT:              Previously seen     Spine:             Previously seen
 LVOT:              Previously seen     Limbs:             Four extremities
                                                           previously seen

 Other:     Fetus appears to be a female. Heels and 5th digit
            previously seen.
Doppler - Fetal Vessels

 Umbilical Artery
 S/D:   2.73           40  %tile
 Umbilical Artery
 Absent DFV:    No     Reverse DFV:    No

Cervix Uterus Adnexa

 Cervical Length:    3        cm

 Cervix:       Normal appearance by transabdominal scan.

 Left Ovary:    Within normal limits.
 Right Ovary:   Within normal limits.
 Adnexa:     No abnormality visualized.
Impression

 Assigned GA is currently 29w 1d.   Downward trending of
 fetal growth curve, with EFW currently at  30 %ile.
 Amniotic fluid within normal limits, with AFI of 16.04 cm.
 UA Doppler within normal limits.
Recommendations

 Continued US follow-up of fetal growth in 4 weeks.

## 2013-12-12 ENCOUNTER — Encounter (HOSPITAL_COMMUNITY): Payer: Self-pay | Admitting: *Deleted

## 2017-11-10 DEATH — deceased

## 2017-12-11 DEATH — deceased

## 2018-12-24 ENCOUNTER — Encounter: Payer: Self-pay | Admitting: Obstetrics

## 2018-12-24 ENCOUNTER — Other Ambulatory Visit (HOSPITAL_COMMUNITY)
Admission: RE | Admit: 2018-12-24 | Discharge: 2018-12-24 | Disposition: A | Discharge status: Home / Self Care | Payer: Medicaid Other | Source: Ambulatory Visit | Attending: Obstetrics | Admitting: Obstetrics

## 2018-12-24 ENCOUNTER — Ambulatory Visit (INDEPENDENT_AMBULATORY_CARE_PROVIDER_SITE_OTHER): Payer: Medicaid Other | Admitting: Obstetrics

## 2018-12-24 ENCOUNTER — Other Ambulatory Visit: Payer: Self-pay

## 2018-12-24 VITALS — BP 111/72 | HR 93 | Wt 133.0 lb

## 2018-12-24 DIAGNOSIS — Z23 Encounter for immunization: Secondary | ICD-10-CM

## 2018-12-24 DIAGNOSIS — Z3480 Encounter for supervision of other normal pregnancy, unspecified trimester: Secondary | ICD-10-CM | POA: Insufficient documentation

## 2018-12-24 DIAGNOSIS — Z3A13 13 weeks gestation of pregnancy: Secondary | ICD-10-CM

## 2018-12-24 DIAGNOSIS — Z3481 Encounter for supervision of other normal pregnancy, first trimester: Secondary | ICD-10-CM

## 2018-12-24 MED ORDER — BLOOD PRESSURE CUFF MISC: 1.0000 | 0 refills | Status: AC

## 2018-12-24 NOTE — Progress Notes (Signed)
Subjective:    Zoe Smith is being seen today for her first obstetrical visit.  This is a planned pregnancy. She is at [redacted]w[redacted]d gestation. Her obstetrical history is significant for none. Relationship with FOB: spouse, living together. Patient does intend to breast feed. Pregnancy history fully reviewed.  The information documented in the HPI was reviewed and verified.  Menstrual History: OB History    Gravida  3   Para  2   Term  2   Preterm      AB      Living  2     SAB      TAB      Ectopic      Multiple      Live Births  2            Patient's last menstrual period was 09/24/2018 (exact date).    Past Medical History:  Diagnosis Date  . Medical history non-contributory   . Spontaneous vaginal delivery 08/17/2010    Past Surgical History:  Procedure Laterality Date  . NO PAST SURGERIES      (Not in a hospital admission)  No Known Allergies  Social History   Tobacco Use  . Smoking status: Never Smoker  Substance Use Topics  . Alcohol use: No    Family History  Problem Relation Age of Onset  . Cancer Mother   . Hypertension Father      Review of Systems Constitutional: negative for weight loss Gastrointestinal: negative for vomiting Genitourinary:negative for genital lesions and vaginal discharge and dysuria Musculoskeletal:negative for back pain Behavioral/Psych: negative for abusive relationship, depression, illegal drug usage and tobacco use    Objective:    BP 111/72   Pulse 93   Wt 133 lb (60.3 kg)   LMP 09/24/2018 (Exact Date)   BMI 25.13 kg/m  General Appearance:    Alert, cooperative, no distress, appears stated age  Head:    Normocephalic, without obvious abnormality, atraumatic  Eyes:    PERRL, conjunctiva/corneas clear, EOM's intact, fundi    benign, both eyes  Ears:    Normal TM's and external ear canals, both ears  Nose:   Nares normal, septum midline, mucosa normal, no drainage    or sinus tenderness  Throat:    Lips, mucosa, and tongue normal; teeth and gums normal  Neck:   Supple, symmetrical, trachea midline, no adenopathy;    thyroid:  no enlargement/tenderness/nodules; no carotid   bruit or JVD  Back:     Symmetric, no curvature, ROM normal, no CVA tenderness  Lungs:     Clear to auscultation bilaterally, respirations unlabored  Chest Wall:    No tenderness or deformity   Heart:    Regular rate and rhythm, S1 and S2 normal, no murmur, rub   or gallop  Breast Exam:    No tenderness, masses, or nipple abnormality  Abdomen:     Soft, non-tender, bowel sounds active all four quadrants,    no masses, no organomegaly  Genitalia:    Normal female without lesion, discharge or tenderness  Extremities:   Extremities normal, atraumatic, no cyanosis or edema  Pulses:   2+ and symmetric all extremities  Skin:   Skin color, texture, turgor normal, no rashes or lesions  Lymph nodes:   Cervical, supraclavicular, and axillary nodes normal  Neurologic:   CNII-XII intact, normal strength, sensation and reflexes    throughout      Lab Review Urine pregnancy test Labs reviewed yes Radiologic studies  reviewed yes  Assessment:    Pregnancy at [redacted]w[redacted]d weeks    Plan:     1. Supervision of other normal pregnancy, antepartum Rx: - Cytology - PAP( [facility]) - Cervicovaginal ancillary only( [place]) - Obstetric Panel (and HIV) - Culture, OB Urine - Panorama - Enroll Patient in Babyscripts - Blood Pressure Monitoring (BLOOD PRESSURE CUFF) MISC; 1 Device by Does not apply route once a week.  Dispense: 1 each; Refill: 0 - Flu Vaccine QUAD 36+ mos IM (Fluarix, Quad PF)  Prenatal vitamins.  Counseling provided regarding continued use of seat belts, cessation of alcohol consumption, smoking or use of illicit drugs; infection precautions i.e., influenza/TDAP immunizations, toxoplasmosis,CMV, parvovirus, listeria and varicella; workplace safety, exercise during pregnancy; routine dental care, safe  medications, sexual activity, hot tubs, saunas, pools, travel, caffeine use, fish and methlymercury, potential toxins, hair treatments, varicose veins Weight gain recommendations per IOM guidelines reviewed: underweight/BMI< 18.5--> gain 28 - 40 lbs; normal weight/BMI 18.5 - 24.9--> gain 25 - 35 lbs; overweight/BMI 25 - 29.9--> gain 15 - 25 lbs; obese/BMI >30->gain  11 - 20 lbs Problem list reviewed and updated. FIRST/CF mutation testing/NIPT/QUAD SCREEN/fragile X/Ashkenazi Jewish population testing/Spinal muscular atrophy discussed: requested. Role of ultrasound in pregnancy discussed; fetal survey: requested. Amniocentesis discussed: not indicated.  Meds ordered this encounter  Medications  . Blood Pressure Monitoring (BLOOD PRESSURE CUFF) MISC    Sig: 1 Device by Does not apply route once a week.    Dispense:  1 each    Refill:  0   Orders Placed This Encounter  Procedures  . Culture, OB Urine  . Obstetric Panel (and HIV)  . Panorama    PANORAMA    Follow up in 4 weeks. 50% of 25 min visit spent on counseling and coordination of care.    Shelly Bombard, MD 12/24/2018 10:38 AM

## 2018-12-24 NOTE — Progress Notes (Signed)
Pt is here for initial OB visit. LMP 09/24/2018. Pt had [place] at pregnancy care center that gave her EDD 07/11/19.

## 2018-12-25 LAB — OBSTETRIC PANEL, INCLUDING HIV
Antibody Screen: NEGATIVE
Basophils Absolute: 0 10*3/uL (ref 0.0–0.2)
Basos: 0 %
EOS (ABSOLUTE): 0.1 10*3/uL (ref 0.0–0.4)
Eos: 1 %
HIV Screen 4th Generation wRfx: NONREACTIVE
Hematocrit: 34.4 % (ref 34.0–46.6)
Hemoglobin: 11.5 g/dL (ref 11.1–15.9)
Hepatitis B Surface Ag: NEGATIVE
Immature Grans (Abs): 0 10*3/uL (ref 0.0–0.1)
Immature Granulocytes: 0 %
Lymphocytes Absolute: 2.3 10*3/uL (ref 0.7–3.1)
Lymphs: 33 %
MCH: 28.8 pg (ref 26.6–33.0)
MCHC: 33.4 g/dL (ref 31.5–35.7)
MCV: 86 fL (ref 79–97)
Monocytes Absolute: 0.4 10*3/uL (ref 0.1–0.9)
Monocytes: 6 %
Neutrophils Absolute: 4.1 10*3/uL (ref 1.4–7.0)
Neutrophils: 60 %
Platelets: 269 10*3/uL (ref 150–450)
RBC: 3.99 x10E6/uL (ref 3.77–5.28)
RDW: 12.3 % (ref 11.7–15.4)
RPR Ser Ql: NONREACTIVE
Rh Factor: POSITIVE
Rubella Antibodies, IGG: 1.48 index (ref >0.99)
WBC: 6.9 10*3/uL (ref 3.4–10.8)

## 2018-12-27 LAB — CULTURE, OB URINE

## 2018-12-27 LAB — CERVICOVAGINAL ANCILLARY ONLY
Bacterial Vaginitis (gardnerella): NEGATIVE
Candida Glabrata: NEGATIVE
Candida Vaginitis: NEGATIVE
Chlamydia: NEGATIVE
Comment: NEGATIVE
Comment: NEGATIVE
Comment: NEGATIVE
Comment: NEGATIVE
Comment: NEGATIVE
Comment: NORMAL
Neisseria Gonorrhea: NEGATIVE
Trichomonas: NEGATIVE

## 2018-12-27 LAB — URINE CULTURE, OB REFLEX

## 2018-12-30 LAB — CYTOLOGY - PAP
Comment: NEGATIVE
Diagnosis: NEGATIVE
High risk HPV: NEGATIVE

## 2019-01-10 ENCOUNTER — Encounter: Payer: Self-pay | Admitting: Obstetrics

## 2019-01-12 ENCOUNTER — Encounter: Payer: Self-pay | Admitting: Obstetrics

## 2019-01-21 ENCOUNTER — Encounter: Payer: Self-pay | Admitting: Obstetrics

## 2019-01-21 ENCOUNTER — Telehealth (INDEPENDENT_AMBULATORY_CARE_PROVIDER_SITE_OTHER): Payer: Medicaid Other | Admitting: Obstetrics

## 2019-01-21 DIAGNOSIS — Z3480 Encounter for supervision of other normal pregnancy, unspecified trimester: Secondary | ICD-10-CM

## 2019-01-21 DIAGNOSIS — Z3A17 17 weeks gestation of pregnancy: Secondary | ICD-10-CM

## 2019-01-21 DIAGNOSIS — Z3482 Encounter for supervision of other normal pregnancy, second trimester: Secondary | ICD-10-CM

## 2019-01-21 MED ORDER — BLOOD PRESSURE KIT DEVI: 1.0000 | 0 refills | Status: AC | PRN

## 2019-01-21 NOTE — Progress Notes (Signed)
S/w pt for virtual visit. Pt reports fetal movement, denies pain. Re-sent BP cuff today.

## 2019-01-21 NOTE — Progress Notes (Signed)
   TELEHEALTH OBSTETRICS PRENATAL VIRTUAL VIDEO VISIT ENCOUNTER NOTE  Provider location: Center for [place] at [place]   I connected with Verta Ellen on 01/21/19 at 11:00 AM EST by OB MyChart Video Encounter at home and verified that I am speaking with the correct person using two identifiers.   I discussed the limitations, risks, security and privacy concerns of performing an evaluation and management service virtually and the availability of in person appointments. I also discussed with the patient that there may be a patient responsible charge related to this service. The patient expressed understanding and agreed to proceed. Subjective:  Zoe Smith is a 33 y.o. G3P2002 at 65w0dbeing seen today for ongoing prenatal care.  She is currently monitored for the following issues for this low-risk pregnancy and has Spontaneous vaginal delivery and Supervision of other normal pregnancy, antepartum on their problem list.  Patient reports no complaints.  Contractions: Not present. Vag. Bleeding: None.  Movement: Present. Denies any leaking of fluid.   The following portions of the patient's history were reviewed and updated as appropriate: allergies, current medications, past family history, past medical history, past social history, past surgical history and problem list.   Objective:  There were no vitals filed for this visit.  Fetal Status:     Movement: Present     General:  Alert, oriented and cooperative. Patient is in no acute distress.  Respiratory: Normal respiratory effort, no problems with respiration noted  Mental Status: Normal mood and affect. Normal behavior. Normal judgment and thought content.  Rest of physical exam deferred due to type of encounter  Imaging: No results found.  Assessment and Plan:  Pregnancy: G3P2002 at 113w0d. Supervision of other normal pregnancy, antepartum Rx: - Blood Pressure Monitoring (BLOOD PRESSURE KIT) DEVI; 1 kit by  Does not apply route as needed.  Dispense: 1 each; Refill: 0   Preterm labor symptoms and general obstetric precautions including but not limited to vaginal bleeding, contractions, leaking of fluid and fetal movement were reviewed in detail with the patient. I discussed the assessment and treatment plan with the patient. The patient was provided an opportunity to ask questions and all were answered. The patient agreed with the plan and demonstrated an understanding of the instructions. The patient was advised to call back or seek an in-person office evaluation[facility]. Please refer to After Visit Summary for other counseling recommendations.   I provided 10 minutes of face-to-face time during this encounter.  Return in about 4 weeks (around 02/18/2019) for MyChart.    ChBaltazar NajjarMD [facility] 01/21/2019

## 2019-02-11 NOTE — L&D Delivery Note (Addendum)
Patient is 34 y.o. [id number] [redacted]w[redacted]d admitted 5/17 for SOL, hx of GDMA1   Delivery Note At 2:41 AM a viable and healthy female was delivered via  (Presentation: Left Occiput Anterior).  APGAR: 8, 9; weight pending.   Placenta status: Spontaneous, Intact.  Cord:   3V, after 2 minute delay, clamped and cut by support person. No complications.  Anesthesia: Epidural Episiotomy:  no Lacerations:  1st degree hemostatic laceration Suture Repair:  none Est. Blood Loss (mL):  347  Mom to postpartum.  Baby to Couplet care / Skin to Skin.  Solmon Ice Meccariello 06/28/2019, 2:55 AM  The above was performed under my direct supervision and guidance.

## 2019-02-18 ENCOUNTER — Telehealth (INDEPENDENT_AMBULATORY_CARE_PROVIDER_SITE_OTHER): Payer: Medicaid Other | Admitting: Obstetrics

## 2019-02-18 ENCOUNTER — Encounter: Payer: Self-pay | Admitting: Obstetrics

## 2019-02-18 DIAGNOSIS — Z3482 Encounter for supervision of other normal pregnancy, second trimester: Secondary | ICD-10-CM

## 2019-02-18 DIAGNOSIS — Z3480 Encounter for supervision of other normal pregnancy, unspecified trimester: Secondary | ICD-10-CM

## 2019-02-18 DIAGNOSIS — Z3A21 21 weeks gestation of pregnancy: Secondary | ICD-10-CM

## 2019-02-18 NOTE — Progress Notes (Signed)
   TELEHEALTH OBSTETRICS PRENATAL VIRTUAL VIDEO VISIT ENCOUNTER NOTE  Provider location: [facility] for [facility] [place]   I connected with [name] on 02/18/19 at 11:00 AM EST by OB MyChart Video Encounter at home and verified that I am speaking with the correct person using two identifiers.   I discussed the limitations, risks, security and privacy concerns of performing an evaluation and management service virtually and the availability of in person appointments. I also discussed with the patient that there may be a patient responsible charge related to this service. The patient expressed understanding and agreed to proceed.  Subjective:  Zoe Smith is a 34 y.o. G3P2002 at [redacted]w[redacted]d being seen today for ongoing prenatal care.  She is currently monitored for the following issues for this low-risk pregnancy and has Spontaneous vaginal delivery and Supervision of other normal pregnancy, antepartum on their problem list.  Patient reports no complaints.  Contractions: Not present. Vag. Bleeding: None.  Movement: Present. Denies any leaking of fluid.   The following portions of the patient's history were reviewed and updated as appropriate: allergies, current medications, past family history, past medical history, past social history, past surgical history and problem list.   Objective:  There were no vitals filed for this visit.  Fetal Status:     Movement: Present     General:  Alert, oriented and cooperative. Patient is in no acute distress.  Respiratory: Normal respiratory effort, no problems with respiration noted  Mental Status: Normal mood and affect. Normal behavior. Normal judgment and thought content.  Rest of physical exam deferred due to type of encounter  Imaging: No results found.  Assessment and Plan:  Pregnancy: G3P2002 at [redacted]w[redacted]d 1. Supervision of other normal pregnancy, antepartum Rx: - [place] MFM OB DETAIL +14 WK; Future  Preterm labor symptoms and  general obstetric precautions including but not limited to vaginal bleeding, contractions, leaking of fluid and fetal movement were reviewed in detail with the patient. I discussed the assessment and treatment plan with the patient. The patient was provided an opportunity to ask questions and all were answered. The patient agreed with the plan and demonstrated an understanding of the instructions. The patient was advised to call back or seek an in-person office evaluation/go to [facility]U at [facility] for any urgent or concerning symptoms. Please refer to After Visit Summary for other counseling recommendations.   I provided 10 minutes of face-to-face time during this encounter.  Return in about 4 weeks (around 03/18/2019) for MyChart.  Future Appointments  Date Time Provider Department Center  03/11/2019 10:00 AM WH-MFC [place] 3 WH-MFCUS MFC-US    Coral Ceo, MD Center for [facility] not have BP cuff at this time. 02/18/2019

## 2019-03-03 ENCOUNTER — Telehealth: Payer: Self-pay | Admitting: *Deleted

## 2019-03-03 DIAGNOSIS — Z3480 Encounter for supervision of other normal pregnancy, unspecified trimester: Secondary | ICD-10-CM | POA: Diagnosis not present

## 2019-03-03 NOTE — Telephone Encounter (Signed)
Pt called to office stating her BP cuff was not covered by insurance.  Insurance was ran and verified to be MCD-FP. Pt made aware and advised to contact her case worker to verify her coverage.  Will make office staff aware so that they can call pt and discuss.

## 2019-03-11 ENCOUNTER — Other Ambulatory Visit (HOSPITAL_COMMUNITY): Payer: Self-pay | Admitting: *Deleted

## 2019-03-11 ENCOUNTER — Ambulatory Visit (HOSPITAL_COMMUNITY)
Admission: RE | Admit: 2019-03-11 | Discharge: 2019-03-11 | Disposition: A | Discharge status: Home / Self Care | Payer: Medicaid Other | Source: Ambulatory Visit | Attending: Obstetrics and Gynecology | Admitting: Obstetrics and Gynecology

## 2019-03-11 ENCOUNTER — Other Ambulatory Visit: Payer: Self-pay

## 2019-03-11 DIAGNOSIS — O359XX0 Maternal care for (suspected) fetal abnormality and damage, unspecified, not applicable or unspecified: Secondary | ICD-10-CM

## 2019-03-11 DIAGNOSIS — Z3480 Encounter for supervision of other normal pregnancy, unspecified trimester: Secondary | ICD-10-CM | POA: Diagnosis not present

## 2019-03-11 DIAGNOSIS — Z3A24 24 weeks gestation of pregnancy: Secondary | ICD-10-CM | POA: Diagnosis not present

## 2019-03-11 DIAGNOSIS — Z362 Encounter for other antenatal screening follow-up: Secondary | ICD-10-CM

## 2019-03-18 ENCOUNTER — Telehealth (INDEPENDENT_AMBULATORY_CARE_PROVIDER_SITE_OTHER): Payer: Self-pay | Admitting: Obstetrics

## 2019-03-18 ENCOUNTER — Encounter: Payer: Self-pay | Admitting: Obstetrics

## 2019-03-18 DIAGNOSIS — Z3482 Encounter for supervision of other normal pregnancy, second trimester: Secondary | ICD-10-CM

## 2019-03-18 DIAGNOSIS — Z3A25 25 weeks gestation of pregnancy: Secondary | ICD-10-CM

## 2019-03-18 DIAGNOSIS — Z3480 Encounter for supervision of other normal pregnancy, unspecified trimester: Secondary | ICD-10-CM

## 2019-03-18 NOTE — Progress Notes (Signed)
S/w pt for virtual visit and pt reports fetal movement, denies pain. Pt currently has nipple piercings and has breastfeeding questions. Pt has FP-MCD, advised to contact case worker to switch to pregnancy medicaid so that she can get BP cuff.

## 2019-03-18 NOTE — Progress Notes (Signed)
TELEHEALTH OBSTETRICS PRENATAL VIRTUAL VIDEO VISIT ENCOUNTER NOTE  Provider location: [facility] for [facility] at [place]   I connected with Zoe Smith on 03/18/19 at 11:00 AM EST by OB MyChart Video Encounter at home and verified that I am speaking with the correct person using two identifiers.   I discussed the limitations, risks, security and privacy concerns of performing an evaluation and management service virtually and the availability of in person appointments. I also discussed with the patient that there may be a patient responsible charge related to this service. The patient expressed understanding and agreed to proceed. Subjective:  Zoe Smith is a 34 y.o. G3P2002 at [redacted]w[redacted]d being seen today for ongoing prenatal care.  She is currently monitored for the following issues for this low-risk pregnancy and has Spontaneous vaginal delivery and Supervision of other normal pregnancy, antepartum on their problem list.  Patient reports no complaints.  Contractions: Not present. Vag. Bleeding: None.  Movement: Present. Denies any leaking of fluid.   The following portions of the patient's history were reviewed and updated as appropriate: allergies, current medications, past family history, past medical history, past social history, past surgical history and problem list.   Objective:  There were no vitals filed for this visit.  Fetal Status:     Movement: Present     General:  Alert, oriented and cooperative. Patient is in no acute distress.  Respiratory: Normal respiratory effort, no problems with respiration noted  Mental Status: Normal mood and affect. Normal behavior. Normal judgment and thought content.  Rest of physical exam deferred due to type of encounter  Imaging: [place] MFM OB DETAIL +14 WK  Result Date: 03/11/2019 ----------------------------------------------------------------------  OBSTETRICS REPORT                       (Signed Final 03/11/2019 11:24 am)  ---------------------------------------------------------------------- Patient Info  ID #:       [id number]                          D.O.B.:  1986/02/04 (33 yrs)  Name:       Zoe Smith              Visit Date: 03/11/2019 10:42 am ---------------------------------------------------------------------- Performed By  Performed By:     Ellin Saba        Ref. Address:     [address]                                                             [address] [phone]  [place]                                                             [place]  Attending:        Johnell Comings MD         [facility]         [facility]  Referred By:      [place] ---------------------------------------------------------------------- Orders   #  Description                          Code         Ordered By   1  [place] MFM OB DETAIL +14 WK              76811.01     Baltazar Najjar  ----------------------------------------------------------------------   #  Order #                    Accession #                 Episode #   1  [id number]                   [id number]                  [id number]  ---------------------------------------------------------------------- Indications   Fetal abnormality - other known or             O35.9XX0   suspected (small for gestational age)   Encounter for antenatal screening for          Z36.3   malformations   [redacted] weeks gestation of pregnancy                Z3A.24   Low risk NIPS, NEG Horizon  ---------------------------------------------------------------------- Fetal Evaluation  Num Of Fetuses:         1  Fetal Heart Rate(bpm):  161  Cardiac Activity:       Observed  Presentation:           Cephalic  Placenta:               Anterior  P. Cord Insertion:      Visualized, central  Amniotic Fluid   AFI FV:      Within normal limits                              Largest Pocket(cm)                              6.59 ---------------------------------------------------------------------- Biometry  BPD:      57.7  mm     G. Age:  23w 5d         31  %    CI:  77.22   %    70 - 86                                                          FL/HC:      19.4   %    18.7 - 20.9  HC:      207.9  mm     G. Age:  22w 6d          5  %    HC/AC:      1.17        1.05 - 1.21  AC:      178.1  mm     G. Age:  22w 5d          9  %    FL/BPD:     69.8   %    71 - 87  FL:       40.3  mm     G. Age:  23w 0d         12  %    FL/AC:      22.6   %    20 - 24  HUM:      37.4  mm     G. Age:  23w 1d         21  %  CER:      24.6  mm     G. Age:  22w 4d         19  %  LV:        5.5  mm  CM:        5.4  mm  Est. FW:     543  gm      1 lb 3 oz      7  % ---------------------------------------------------------------------- OB History  Gravidity:    3         Term:   2  Living:       2 ---------------------------------------------------------------------- Gestational Age  LMP:           24w 0d        Date:  09/24/18                 EDD:   07/01/19  U/S Today:     23w 1d                                        EDD:   07/07/19  Best:          24w 0d     Det. By:  LMP  (09/24/18)          EDD:   07/01/19 ---------------------------------------------------------------------- Anatomy  Cranium:               Appears normal         LVOT:                   Appears normal  Cavum:                 Appears normal         Aortic Arch:            Silvestre Gunner  normal  Ventricles:            Appears normal         Ductal Arch:            Not well visualized  Choroid Plexus:        Appears normal         Diaphragm:              Appears normal  Cerebellum:            Appears normal         Abdomen:                Appears normal  Posterior Fossa:       Appears normal         Abdominal Wall:         Appears nml (cord                                                                         insert, abd wall)  Nuchal Fold:           Not applicable (>20    Cord Vessels:           Appears normal ([redacted]                         wks GA)                                        vessel cord)  Face:                  Appears normal         Kidneys:                Appear normal                         (orbits and profile)  Lips:                  Appears normal         Bladder:                Appears normal  Palate:                Appears normal         Spine:                  Appears normal  Thoracic:              Appears normal         Upper Extremities:      Appears normal  Heart:                 Appears normal         Lower Extremities:      Appears normal                         (4CH, axis, and  situs)  RVOT:                  Appears normal  Other:  Nasal bone visualized. Heels and 5th digit visualized. ---------------------------------------------------------------------- Cervix Uterus Adnexa  Cervix  Length:           3.49  cm.  Normal appearance by transabdominal scan.  Uterus  No abnormality visualized.  Left Ovary  Within normal limits.  Right Ovary  Within normal limits.  Cul De Sac  No free fluid seen. ---------------------------------------------------------------------- Comments  This patient was seen for a detailed fetal anatomy scan. She  denies any significant past medical history and denies any  problems in her current pregnancy.  She had a cell free DNA test earlier in her pregnancy which  indicated a low risk for trisomy 71, 47, and 13.  The overall fetal growth measures in the lower normal range.  The patient reports that she tends to have smaller babies.  Her two other children were born at full-term weighing 5-1/2  to 6 pounds.  There was normal amniotic fluid noted today.  There were no obvious fetal anomalies noted on today's  The patient was informed that anomalies may be missed due  to technical limitations. If the fetus is in a suboptimal position   or maternal habitus is increased, visualization of the fetus in  the maternal uterus may be impaired.  A follow-up exam was scheduled in 4 weeks to assess the  fetal growth. ----------------------------------------------------------------------                   Ma Rings, MD Electronically Signed Final Report   03/11/2019 11:24 am ----------------------------------------------------------------------   Assessment and Plan:  Pregnancy: [id number] at [redacted]w[redacted]d 1. Supervision of other normal pregnancy, antepartum   Preterm labor symptoms and general obstetric precautions including but not limited to vaginal bleeding, contractions, leaking of fluid and fetal movement were reviewed in detail with the patient. I discussed the assessment and treatment plan with the patient. The patient was provided an opportunity to ask questions and all were answered. The patient agreed with the plan and demonstrated an understanding of the instructions. The patient was advised to call back or seek an in-person office evaluation/go to [facility]U at [facility] for any urgent or concerning symptoms. Please refer to After Visit Summary for other counseling recommendations.   I provided 10 minutes of face-to-face time during this encounter.  Return in about 3 weeks (around 04/08/2019) for ROB, 2 hour OGTT.  Future Appointments  Date Time Provider Department Center  04/08/2019 10:30 AM WH-MFC [place] 1 WH-MFCUS MFC-US    Coral Ceo, MD Center for [facility] 03/18/2019

## 2019-04-07 ENCOUNTER — Ambulatory Visit (INDEPENDENT_AMBULATORY_CARE_PROVIDER_SITE_OTHER): Payer: Medicaid Other | Admitting: Obstetrics

## 2019-04-07 ENCOUNTER — Other Ambulatory Visit: Payer: Medicaid Other

## 2019-04-07 ENCOUNTER — Encounter: Payer: Self-pay | Admitting: Obstetrics

## 2019-04-07 ENCOUNTER — Other Ambulatory Visit: Payer: Self-pay

## 2019-04-07 VITALS — BP 114/70 | HR 92 | Wt 140.4 lb

## 2019-04-07 DIAGNOSIS — Z3482 Encounter for supervision of other normal pregnancy, second trimester: Secondary | ICD-10-CM

## 2019-04-07 DIAGNOSIS — Z23 Encounter for immunization: Secondary | ICD-10-CM | POA: Diagnosis not present

## 2019-04-07 DIAGNOSIS — Z3A27 27 weeks gestation of pregnancy: Secondary | ICD-10-CM

## 2019-04-07 DIAGNOSIS — Z3480 Encounter for supervision of other normal pregnancy, unspecified trimester: Secondary | ICD-10-CM | POA: Diagnosis not present

## 2019-04-07 NOTE — Progress Notes (Signed)
Patient reports fetal movement, denies pain. 

## 2019-04-07 NOTE — Progress Notes (Signed)
Subjective:  Zoe Smith is a 34 y.o. G3P2002 at [redacted]w[redacted]d being seen today for ongoing prenatal care.  She is currently monitored for the following issues for this low-risk pregnancy and has Spontaneous vaginal delivery and Supervision of other normal pregnancy, antepartum on their problem list.  Patient reports no complaints.  Contractions: Not present. Vag. Bleeding: None.  Movement: Present. Denies leaking of fluid.   The following portions of the patient's history were reviewed and updated as appropriate: allergies, current medications, past family history, past medical history, past social history, past surgical history and problem list. Problem list updated.  Objective:   Vitals:   04/07/19 0920  BP: 114/70  Pulse: 92  Weight: 140 lb 6.4 oz (63.7 kg)    Fetal Status:     Movement: Present     General:  Alert, oriented and cooperative. Patient is in no acute distress.  Skin: Skin is warm and dry. No rash noted.   Cardiovascular: Normal heart rate noted  Respiratory: Normal respiratory effort, no problems with respiration noted  Abdomen: Soft, gravid, appropriate for gestational age. Pain/Pressure: Absent     Pelvic:  Cervical exam deferred        Extremities: Normal range of motion.  Edema: None  Mental Status: Normal mood and affect. Normal behavior. Normal judgment and thought content.   Urinalysis:      Assessment and Plan:  Pregnancy: G3P2002 at [redacted]w[redacted]d  1. Supervision of other normal pregnancy, antepartum Rx: - Glucose Tolerance, 2 Hours w/1 Hour - RPR - CBC - HIV antibody (with reflex) - Tdap vaccine greater than or equal to 7yo IM  Preterm labor symptoms and general obstetric precautions including but not limited to vaginal bleeding, contractions, leaking of fluid and fetal movement were reviewed in detail with the patient. Please refer to After Visit Summary for other counseling recommendations.   Return in about 2 weeks (around 04/21/2019) for  MyChart.   Brock Bad, MD  04/07/2019

## 2019-04-08 ENCOUNTER — Ambulatory Visit (HOSPITAL_COMMUNITY)
Admission: RE | Admit: 2019-04-08 | Discharge: 2019-04-08 | Disposition: A | Discharge status: Home / Self Care | Payer: Medicaid Other | Source: Ambulatory Visit | Attending: Obstetrics and Gynecology | Admitting: Obstetrics and Gynecology

## 2019-04-08 ENCOUNTER — Other Ambulatory Visit: Payer: Self-pay | Admitting: Obstetrics

## 2019-04-08 DIAGNOSIS — O359XX0 Maternal care for (suspected) fetal abnormality and damage, unspecified, not applicable or unspecified: Secondary | ICD-10-CM | POA: Diagnosis not present

## 2019-04-08 DIAGNOSIS — Z3A28 28 weeks gestation of pregnancy: Secondary | ICD-10-CM | POA: Diagnosis not present

## 2019-04-08 DIAGNOSIS — Z362 Encounter for other antenatal screening follow-up: Secondary | ICD-10-CM | POA: Insufficient documentation

## 2019-04-08 DIAGNOSIS — O99019 Anemia complicating pregnancy, unspecified trimester: Secondary | ICD-10-CM

## 2019-04-08 LAB — GLUCOSE TOLERANCE, 2 HOURS W/ 1HR
Glucose, 1 hour: 177 mg/dL (ref 65–179)
Glucose, 2 hour: 156 mg/dL — ABNORMAL HIGH (ref 65–152)
Glucose, Fasting: 76 mg/dL (ref 65–91)

## 2019-04-08 LAB — CBC
Hematocrit: 32.4 % — ABNORMAL LOW (ref 34.0–46.6)
Hemoglobin: 10.6 g/dL — ABNORMAL LOW (ref 11.1–15.9)
MCH: 27.5 pg (ref 26.6–33.0)
MCHC: 32.7 g/dL (ref 31.5–35.7)
MCV: 84 fL (ref 79–97)
Platelets: 222 10*3/uL (ref 150–450)
RBC: 3.85 x10E6/uL (ref 3.77–5.28)
RDW: 13.1 % (ref 11.7–15.4)
WBC: 7.4 10*3/uL (ref 3.4–10.8)

## 2019-04-08 LAB — RPR: RPR Ser Ql: NONREACTIVE

## 2019-04-08 LAB — HIV ANTIBODY (ROUTINE TESTING W REFLEX): HIV Screen 4th Generation wRfx: NONREACTIVE

## 2019-04-08 MED ORDER — FERROUS SULFATE 325 (65 FE) MG PO TABS: 325.0000 mg | ORAL_TABLET | Freq: Two times a day (BID) | ORAL | 5 refills | Status: AC

## 2019-04-12 ENCOUNTER — Other Ambulatory Visit: Payer: Self-pay

## 2019-04-12 DIAGNOSIS — O24419 Gestational diabetes mellitus in pregnancy, unspecified control: Secondary | ICD-10-CM

## 2019-04-12 MED ORDER — ACCU-CHEK GUIDE VI STRP: ORAL_STRIP | 12 refills | Status: AC

## 2019-04-12 MED ORDER — ACCU-CHEK GUIDE W/DEVICE KIT: 1.0000 | PACK | Freq: Four times a day (QID) | 0 refills | Status: AC

## 2019-04-12 MED ORDER — ACCU-CHEK SOFTCLIX LANCETS MISC: 1.0000 | Freq: Four times a day (QID) | 12 refills | Status: AC

## 2019-04-13 ENCOUNTER — Telehealth: Payer: Self-pay

## 2019-04-13 NOTE — Telephone Encounter (Signed)
The 2 hour GTT should not be repeated.

## 2019-04-13 NOTE — Telephone Encounter (Signed)
TC from pt regarding recent result for 2 hr GTT. Pt noted abnormal values were not far from normal.  Pt would like to know if possible could she repeat the 2 hr GTT.   Please advise

## 2019-04-21 ENCOUNTER — Encounter: Payer: Self-pay | Admitting: Obstetrics

## 2019-04-21 ENCOUNTER — Telehealth (INDEPENDENT_AMBULATORY_CARE_PROVIDER_SITE_OTHER): Payer: Medicaid Other | Admitting: Obstetrics

## 2019-04-21 DIAGNOSIS — Z3A29 29 weeks gestation of pregnancy: Secondary | ICD-10-CM

## 2019-04-21 DIAGNOSIS — Z3480 Encounter for supervision of other normal pregnancy, unspecified trimester: Secondary | ICD-10-CM

## 2019-04-21 DIAGNOSIS — Z3483 Encounter for supervision of other normal pregnancy, third trimester: Secondary | ICD-10-CM

## 2019-04-21 NOTE — Progress Notes (Signed)
TELEHEALTH OBSTETRICS PRENATAL VIRTUAL VIDEO VISIT ENCOUNTER NOTE  Provider location: Center for [facility] at [place]   I connected with Zoe Smith on 04/21/19 at 10:15 AM EST by OB MyChart Video Encounter at home and verified that I am speaking with the correct person using two identifiers.   I discussed the limitations, risks, security and privacy concerns of performing an evaluation and management service virtually and the availability of in person appointments. I also discussed with the patient that there may be a patient responsible charge related to this service. The patient expressed understanding and agreed to proceed. Subjective:  Zoe Smith is a 34 y.o. G3P2002 at [redacted]w[redacted]d being seen today for ongoing prenatal care.  She is currently monitored for the following issues for this low-risk pregnancy and has Spontaneous vaginal delivery and Supervision of other normal pregnancy, antepartum on their problem list.  Patient reports no complaints.  Contractions: Not present. Vag. Bleeding: None.  Movement: Present. Denies any leaking of fluid.   The following portions of the patient's history were reviewed and updated as appropriate: allergies, current medications, past family history, past medical history, past social history, past surgical history and problem list.   Objective:  There were no vitals filed for this visit.  Fetal Status:     Movement: Present     General:  Alert, oriented and cooperative. Patient is in no acute distress.  Respiratory: Normal respiratory effort, no problems with respiration noted  Mental Status: Normal mood and affect. Normal behavior. Normal judgment and thought content.  Rest of physical exam deferred due to type of encounter  Imaging: [place] MFM OB FOLLOW UP  Result Date: 04/09/2019 ----------------------------------------------------------------------  OBSTETRICS REPORT                        (Signed Final 04/09/2019 07:03 am)  ---------------------------------------------------------------------- Patient Info  ID #:       [id number]                          D.O.B.:  11/06/1985 (33 yrs)  Name:       Zoe Smith              Visit Date: 04/08/2019 10:41 am ---------------------------------------------------------------------- Performed By  Performed By:     Zoe Smith          Ref. Address:      [address]                                                              [address] [phone]  [place]                                                              [id number]  Attending:        Lin Landsmanorenthian Smith      [facility]  Referred By:      Zoe Smith ---------------------------------------------------------------------- Orders   #  Description                          Code         Ordered By   1  US MFM OB FOLLOW UP                  [id number]     Zoe Smith  ----------------------------------------------------------------------   #  Order #                    Accession #                 Episode #   1  [id number]                  [id number]                  [id number]  ---------------------------------------------------------------------- Indications   [redacted] weeks gestation of pregnancy                Z3A.28   Fetal abnormality - other known or             O35.9XX0   suspected (small for gestational age)   Low risk NIPS, NEG Horizon   Encounter for other antenatal screening        Z36.2   follow-up  ---------------------------------------------------------------------- Fetal Evaluation  Num Of Fetuses:          1  Fetal Heart Rate(bpm):   134  Cardiac Activity:        Observed  Presentation:            Cephalic  Placenta:                Anterior  P. Cord Insertion:       Visualized, central  Amniotic Fluid   AFI FV:      Within normal limits                              Largest Pocket(cm)                              5.9 ---------------------------------------------------------------------- Biometry  BPD:      68.4  mm     G. Age:  27w 4d         23  %    CI:  77.2  %    70 - 86                                                          FL/HC:       19.5  %    18.8 - 20.6  HC:      246.5  mm     G. Age:  26w 5d          2  %    HC/AC:       1.00       1.05 - 1.21  AC:      247.1  mm     G. Age:  29w 0d         71  %    FL/BPD:      70.2  %    71 - 87  FL:         48  mm     G. Age:  26w 1d          3  %    FL/AC:       19.4  %    20 - 24  Est. FW:    1111   gm     2 lb 7 oz     26  % ---------------------------------------------------------------------- OB History  Gravidity:    3         Term:   2  Living:       2 ---------------------------------------------------------------------- Gestational Age  LMP:           28w 0d        Date:  09/24/18                 EDD:   07/01/19  U/S Today:     27w 3d                                        EDD:   07/05/19  Best:          28w 0d     Det. By:  LMP  (09/24/18)          EDD:   07/01/19 ---------------------------------------------------------------------- Anatomy  Cranium:               Appears normal         LVOT:                   Previously seen  Cavum:                 Previously seen        Aortic Arch:            Previously seen  Ventricles:            Appears normal         Ductal Arch:            Not well visualized  Choroid Plexus:        Previously seen        Diaphragm:              Previously seen  Cerebellum:  Previously seen        Stomach:                Appears normal, left                                                                        sided  Posterior Fossa:       Previously seen        Abdomen:                Previously seen  Nuchal Fold:           Not applicable (>20    Abdominal Wall:         Previously seen                          wks GA)  Face:                  Orbits and profile     Cord Vessels:           Previously seen                         previously seen  Lips:                  Previously seen        Kidneys:                Appear normal  Palate:                Previously seen        Bladder:                Appears normal  Thoracic:              Appears normal         Spine:                  Previously seen  Heart:                 Previously seen        Upper Extremities:      Previously seen  RVOT:                  Appears normal         Lower Extremities:      Previously seen  Other:  Nasal bone visualized previously. Heels and 5th digit visualized          previously. ---------------------------------------------------------------------- Cervix Uterus Adnexa  Cervix  Not visualized (advanced GA >24wks)  Uterus  No abnormality visualized.  Left Ovary  No adnexal mass visualized.  Right Ovary  No adnexal mass visualized.  Cul De Sac  No free fluid seen.  Adnexa  No abnormality visualized. ---------------------------------------------------------------------- Impression  Prior exam EFW 7th%, today's exam is appropriate for  gestational age.  There was good fetal movement and amniotic fluid.  Low risk NIPS ---------------------------------------------------------------------- Recommendations  Follow up growth as clinically indicated. ----------------------------------------------------------------------               Zoe Landsman, MD Electronically Signed Final Report  04/09/2019 07:03 am ----------------------------------------------------------------------   Assessment and Plan:  Pregnancy: G3P2002 at [redacted]w[redacted]d 1. Supervision of other normal pregnancy, antepartum   Preterm labor symptoms and general obstetric precautions including but not limited to vaginal bleeding, contractions, leaking of fluid and fetal movement were reviewed in detail with the patient. I discussed the assessment and treatment plan with the patient.  The patient was provided an opportunity to ask questions and all were answered. The patient agreed with the plan and demonstrated an understanding of the instructions. The patient was advised to call back or seek an in-person office evaluation/go to MAU at [facility] for any urgent or concerning symptoms. Please refer to After Visit Summary for other counseling recommendations.   I provided 10 minutes of face-to-face time during this encounter.  Return in about 2 weeks (around 05/05/2019).  Future Appointments  Date Time Provider [place]  04/27/2019  3:15 PM [place] [facility] CLASS [place] [facility]    Baltazar Najjar, MD Center for [facility] [place] [facility] 04/21/2019

## 2019-04-27 ENCOUNTER — Encounter: Payer: Medicaid Other | Attending: Obstetrics | Admitting: Registered"

## 2019-04-27 ENCOUNTER — Other Ambulatory Visit: Payer: Self-pay

## 2019-04-27 DIAGNOSIS — O99810 Abnormal glucose complicating pregnancy: Secondary | ICD-10-CM | POA: Insufficient documentation

## 2019-05-05 ENCOUNTER — Encounter: Payer: Self-pay | Admitting: Obstetrics

## 2019-05-05 ENCOUNTER — Other Ambulatory Visit: Payer: Self-pay

## 2019-05-05 ENCOUNTER — Ambulatory Visit (INDEPENDENT_AMBULATORY_CARE_PROVIDER_SITE_OTHER): Payer: Medicaid Other | Admitting: Obstetrics

## 2019-05-05 VITALS — BP 91/59 | HR 94 | Wt 139.0 lb

## 2019-05-05 DIAGNOSIS — Z3A31 31 weeks gestation of pregnancy: Secondary | ICD-10-CM

## 2019-05-05 DIAGNOSIS — O0990 Supervision of high risk pregnancy, unspecified, unspecified trimester: Secondary | ICD-10-CM

## 2019-05-05 DIAGNOSIS — O24410 Gestational diabetes mellitus in pregnancy, diet controlled: Secondary | ICD-10-CM

## 2019-05-05 NOTE — Progress Notes (Signed)
Core clapping

## 2019-05-05 NOTE — Progress Notes (Signed)
Subjective:  Zoe Smith is a 34 y.o. G3P2002 at [redacted]w[redacted]d being seen today for ongoing prenatal care.  She is currently monitored for the following issues for this high-risk pregnancy and has Spontaneous vaginal delivery and Supervision of other normal pregnancy, antepartum on their problem list.  Patient reports no complaints.  Contractions: Not present. Vag. Bleeding: None.  Movement: Present. Denies leaking of fluid.   The following portions of the patient's history were reviewed and updated as appropriate: allergies, current medications, past family history, past medical history, past social history, past surgical history and problem list. Problem list updated.  Objective:   Vitals:   05/05/19 1118  BP: (!) 91/59  Pulse: 94  Weight: 139 lb (63 kg)    Fetal Status:     Movement: Present     General:  Alert, oriented and cooperative. Patient is in no acute distress.  Skin: Skin is warm and dry. No rash noted.   Cardiovascular: Normal heart rate noted  Respiratory: Normal respiratory effort, no problems with respiration noted  Abdomen: Soft, gravid, appropriate for gestational age. Pain/Pressure: Present     Pelvic:  Cervical exam deferred        Extremities: Normal range of motion.  Edema: None  Mental Status: Normal mood and affect. Normal behavior. Normal judgment and thought content.   Urinalysis:      Assessment and Plan:  Pregnancy: G3P2002 at [redacted]w[redacted]d  1. Supervision of high risk pregnancy, antepartum  2. Diet controlled gestational diabetes mellitus (GDM), antepartum - good glucose control with FPS's < 92 and PP's usually ~ 120's or less   Preterm labor symptoms and general obstetric precautions including but not limited to vaginal bleeding, contractions, leaking of fluid and fetal movement were reviewed in detail with the patient. Please refer to After Visit Summary for other counseling recommendations.   Return in about 2 weeks (around 05/19/2019) for MyChart  HOB-Faculty Only.   Brock Bad, MD  05/05/2019

## 2019-05-07 ENCOUNTER — Encounter: Payer: Self-pay | Admitting: Registered"

## 2019-05-07 DIAGNOSIS — O99810 Abnormal glucose complicating pregnancy: Secondary | ICD-10-CM | POA: Insufficient documentation

## 2019-05-07 NOTE — Progress Notes (Signed)
Patient was seen on 04/27/19 for Gestational Diabetes self-management class at [facility]. The following learning objectives were met by the patient during this course:   States the definition of Gestational Diabetes  States why dietary management is important in controlling blood glucose  Describes the effects each nutrient has on blood glucose levels  Demonstrates ability to create a balanced meal plan  Demonstrates carbohydrate counting   States when to check blood glucose levels  Demonstrates proper blood glucose monitoring techniques  States the effect of stress and exercise on blood glucose levels  States the importance of limiting caffeine and abstaining from alcohol and smoking  Blood glucose monitor given: none, brought Accu chek to class  Patient instructed to monitor glucose levels: FBS: 60 - <95; 1 hour: <140; 2 hour: <120  Patient received handouts:  Nutrition Diabetes and Pregnancy, including carb counting list  Patient will be seen for follow-up as needed. 

## 2019-05-18 ENCOUNTER — Encounter: Payer: Self-pay | Admitting: Obstetrics & Gynecology

## 2019-05-18 ENCOUNTER — Telehealth (INDEPENDENT_AMBULATORY_CARE_PROVIDER_SITE_OTHER): Payer: Medicaid Other | Admitting: Obstetrics & Gynecology

## 2019-05-18 VITALS — BP 96/59 | HR 83

## 2019-05-18 DIAGNOSIS — Z3480 Encounter for supervision of other normal pregnancy, unspecified trimester: Secondary | ICD-10-CM

## 2019-05-18 DIAGNOSIS — O99810 Abnormal glucose complicating pregnancy: Secondary | ICD-10-CM | POA: Diagnosis not present

## 2019-05-18 DIAGNOSIS — Z3A33 33 weeks gestation of pregnancy: Secondary | ICD-10-CM | POA: Diagnosis not present

## 2019-05-18 NOTE — Patient Instructions (Signed)
Gestational Diabetes Mellitus, Diagnosis Gestational diabetes (gestational diabetes mellitus) is a short-term (temporary) form of diabetes that can happen during pregnancy. It goes away after you give birth. It may be caused by one or both of these problems:  Your pancreas does not make enough of a hormone called insulin.  Your body does not respond in a normal way to insulin that it makes. Insulin lets sugars (glucose) go into cells in the body. This gives you energy. If you have diabetes, sugars cannot get into cells. This causes high blood sugar (hyperglycemia). If you get gestational diabetes, you are:  More likely to get it if you get pregnant again.  More likely to develop type 2 diabetes in the future. If gestational diabetes is treated, it may not hurt you or your baby. Your doctor will set treatment goals for you. In general, you should have these blood sugar levels:  After not eating for a long time (fasting): 95 mg/dL (5.3 mmol/L).  After meals (postprandial): ? One hour after a meal: at or below 140 mg/dL (7.8 mmol/L). ? Two hours after a meal: at or below 120 mg/dL (6.7 mmol/L).  A1c (hemoglobin A1c) level: 6-6.5%. Follow these instructions at home: Questions to ask your doctor   You may want to ask these questions: ? Do I need to meet with a diabetes educator? ? What equipment will I need to care for myself at home? ? What medicines do I need? When should I take them? ? How often do I need to check my blood sugar? ? What number can I call if I have questions? ? When is my next doctor's visit? General instructions  Take over-the-counter and prescription medicines only as told by your doctor.  Stay at a healthy weight during pregnancy.  Keep all follow-up visits as told by your doctor. This is important. Contact a doctor if:  Your blood sugar is at or above 240 mg/dL (13.3 mmol/L).  Your blood sugar is at or above 200 mg/dL (11.1 mmol/L) and you have ketones in  your pee (urine).  You have been sick or have had a fever for 2 days or more and you are not getting better.  You have any of these problems for more than 6 hours: ? You cannot eat or drink. ? You feel sick to your stomach (nauseous). ? You throw up (vomit). ? You have watery poop (diarrhea). Get help right away if:  Your blood sugar is lower than 54 mg/dL (3 mmol/L).  You get confused.  You have trouble: ? Thinking clearly. ? Breathing.  Your baby moves less than normal.  You have any of these: ? Moderate or large ketone levels in your pee. ? Blood coming from your vagina. ? Unusual fluid coming from your vagina. ? Early contractions. These may feel like tightness in your belly. Summary  Gestational diabetes is a short-term form of diabetes. It can happen while you are pregnant. It goes away after you give birth.  If gestational diabetes is treated, it may not hurt you or your baby. Your doctor will set treatment goals for you.  Keep all follow-up visits as told by your doctor. This is important. This information is not intended to replace advice given to you by your health care provider. Make sure you discuss any questions you have with your health care provider. Document Revised: 03/05/2017 Document Reviewed: 03/02/2015 Elsevier Patient Education  2020 Elsevier Inc.  

## 2019-05-18 NOTE — Progress Notes (Signed)
I connected with  Zoe Smith on 05/18/19 by a video enabled telemedicine application and verified that I am speaking with the correct person using two identifiers.   I discussed the limitations of evaluation and management by telemedicine. The patient expressed understanding and agreed to proceed.  Mychart ROB.  Reports no problems today.

## 2019-05-18 NOTE — Progress Notes (Signed)
   TELEHEALTH VIRTUAL OBSTETRICS VISIT ENCOUNTER NOTE  I connected with [name] on 05/18/19 at 11:15 AM EDT by telephone at home and verified that I am speaking with the correct person using two identifiers.   I discussed the limitations, risks, security and privacy concerns of performing an evaluation and management service by telephone and the availability of in person appointments. I also discussed with the patient that there may be a patient responsible charge related to this service. The patient expressed understanding and agreed to proceed.  Subjective:  Zoe Smith is a 34 y.o. G3P2002 at [redacted]w[redacted]d being followed for ongoing prenatal care.  She is currently monitored for the following issues for this high-risk pregnancy and has Supervision of other normal pregnancy, antepartum and Abnormal glucose tolerance test (GTT) during pregnancy, antepartum on their problem list.  Patient reports no complaints. Reports fetal movement. Denies any contractions, bleeding or leaking of fluid.   The following portions of the patient's history were reviewed and updated as appropriate: allergies, current medications, past family history, past medical history, past social history, past surgical history and problem list.   Objective:   General:  Alert, oriented and cooperative.   Mental Status: Normal mood and affect perceived. Normal judgment and thought content.  Rest of physical exam deferred due to type of encounter  Assessment and Plan:  Pregnancy: G3P2002 at [redacted]w[redacted]d 1. Supervision of other normal pregnancy, antepartum Doing well BP normal  2. Abnormal glucose tolerance test (GTT) during pregnancy, antepartum Diet controlled - good glucose control with FPS's < 95 and PP's usually ~ 120's or less   Preterm labor symptoms and general obstetric precautions including but not limited to vaginal bleeding, contractions, leaking of fluid and fetal movement were reviewed in detail with the  patient.  I discussed the assessment and treatment plan with the patient. The patient was provided an opportunity to ask questions and all were answered. The patient agreed with the plan and demonstrated an understanding of the instructions. The patient was advised to call back or seek an in-person office evaluation/go to [facility]U at [facility] for any urgent or concerning symptoms. Please refer to After Visit Summary for other counseling recommendations.   I provided 12 minutes of non-face-to-face time during this encounter.  Return in about 2 weeks (around 06/01/2019) for GBS.  No future appointments.  Scheryl Darter, MD Center for [facility], [facility]

## 2019-06-01 ENCOUNTER — Ambulatory Visit (INDEPENDENT_AMBULATORY_CARE_PROVIDER_SITE_OTHER): Payer: Medicaid Other | Admitting: Obstetrics & Gynecology

## 2019-06-01 ENCOUNTER — Other Ambulatory Visit: Payer: Self-pay

## 2019-06-01 ENCOUNTER — Other Ambulatory Visit (HOSPITAL_COMMUNITY)
Admission: RE | Admit: 2019-06-01 | Discharge: 2019-06-01 | Disposition: A | Discharge status: Home / Self Care | Payer: Medicaid Other | Source: Ambulatory Visit | Attending: Obstetrics & Gynecology | Admitting: Obstetrics & Gynecology

## 2019-06-01 VITALS — BP 99/61 | HR 98 | Wt 140.0 lb

## 2019-06-01 DIAGNOSIS — Z3480 Encounter for supervision of other normal pregnancy, unspecified trimester: Secondary | ICD-10-CM | POA: Diagnosis not present

## 2019-06-01 DIAGNOSIS — O99810 Abnormal glucose complicating pregnancy: Secondary | ICD-10-CM

## 2019-06-01 DIAGNOSIS — Z3A35 35 weeks gestation of pregnancy: Secondary | ICD-10-CM

## 2019-06-01 NOTE — Patient Instructions (Signed)

## 2019-06-01 NOTE — Progress Notes (Signed)
   PRENATAL VISIT NOTE  Subjective:  Zoe Smith is a 34 y.o. G3P2002 at [redacted]w[redacted]d being seen today for ongoing prenatal care.  She is currently monitored for the following issues for this high-risk pregnancy and has Supervision of other normal pregnancy, antepartum and Abnormal glucose tolerance test (GTT) during pregnancy, antepartum on their problem list.  Patient reports no complaints.  Contractions: Irregular. Vag. Bleeding: None.  Movement: Present. Denies leaking of fluid.   The following portions of the patient's history were reviewed and updated as appropriate: allergies, current medications, past family history, past medical history, past social history, past surgical history and problem list.   Objective:   Vitals:   06/01/19 1107  BP: 99/61  Pulse: 98  Weight: 140 lb (63.5 kg)    Fetal Status: Fetal Heart Rate (bpm): 145   Movement: Present  Presentation: Vertex  General:  Alert, oriented and cooperative. Patient is in no acute distress.  Skin: Skin is warm and dry. No rash noted.   Cardiovascular: Normal heart rate noted  Respiratory: Normal respiratory effort, no problems with respiration noted  Abdomen: Soft, gravid, appropriate for gestational age.  Pain/Pressure: Present     Pelvic: Cervical exam performed in the presence of a chaperone Dilation: 1.5 Effacement (%): 40 Station: -3  Extremities: Normal range of motion.     Mental Status: Normal mood and affect. Normal behavior. Normal judgment and thought content.   Assessment and Plan:  Pregnancy: G3P2002 at [redacted]w[redacted]d 1. Supervision of other normal pregnancy, antepartum Routine test - Strep Gp B NAA - Cervicovaginal ancillary only( [place])  2. Abnormal glucose tolerance test (GTT) during pregnancy, antepartum Diet control is still good - [place] MFM OB FOLLOW UP; Future  Preterm labor symptoms and general obstetric precautions including but not limited to vaginal bleeding, contractions, leaking of fluid and  fetal movement were reviewed in detail with the patient. Please refer to After Visit Summary for other counseling recommendations.   Return in about 1 week (around 06/08/2019) for virtual.  Future Appointments  Date Time Provider Department Center  06/08/2019 11:15 AM Hermina Staggers, MD [facility] None    Scheryl Darter, MD

## 2019-06-02 LAB — CERVICOVAGINAL ANCILLARY ONLY
Chlamydia: NEGATIVE
Comment: NEGATIVE
Comment: NEGATIVE
Comment: NORMAL
Neisseria Gonorrhea: NEGATIVE
Trichomonas: NEGATIVE

## 2019-06-03 LAB — STREP GP B NAA: Strep Gp B NAA: NEGATIVE

## 2019-06-08 ENCOUNTER — Telehealth (INDEPENDENT_AMBULATORY_CARE_PROVIDER_SITE_OTHER): Payer: Medicaid Other | Admitting: Obstetrics and Gynecology

## 2019-06-08 ENCOUNTER — Encounter: Payer: Self-pay | Admitting: Obstetrics and Gynecology

## 2019-06-08 VITALS — BP 108/64 | HR 101

## 2019-06-08 DIAGNOSIS — O99810 Abnormal glucose complicating pregnancy: Secondary | ICD-10-CM

## 2019-06-08 DIAGNOSIS — Z3480 Encounter for supervision of other normal pregnancy, unspecified trimester: Secondary | ICD-10-CM

## 2019-06-08 NOTE — Progress Notes (Signed)
   OBSTETRICS PRENATAL VIRTUAL VISIT ENCOUNTER NOTE  Provider location: [facility] for [facility] [place]   I connected with Zoe Smith on 06/08/19 at 11:15 AM EDT by MyChart Video Encounter at home and verified that I am speaking with the correct person using two identifiers.   I discussed the limitations, risks, security and privacy concerns of performing an evaluation and management service virtually and the availability of in person appointments. I also discussed with the patient that there may be a patient responsible charge related to this service. The patient expressed understanding and agreed to proceed. Subjective:  Zoe Smith is a 34 y.o. G3P2002 at [redacted]w[redacted]d being seen today for ongoing prenatal care.  She is currently monitored for the following issues for this high-risk pregnancy and has Supervision of other normal pregnancy, antepartum and Abnormal glucose tolerance test (GTT) during pregnancy, antepartum on their problem list.  Patient reports general discomforts of pregnancy.  Contractions: Not present. Vag. Bleeding: None.  Movement: Present. Denies any leaking of fluid.   The following portions of the patient's history were reviewed and updated as appropriate: allergies, current medications, past family history, past medical history, past social history, past surgical history and problem list.   Objective:   Vitals:   06/08/19 1038  BP: 108/64  Pulse: (!) 101    Fetal Status:     Movement: Present     General:  Alert, oriented and cooperative. Patient is in no acute distress.  Respiratory: Normal respiratory effort, no problems with respiration noted  Mental Status: Normal mood and affect. Normal behavior. Normal judgment and thought content.  Rest of physical exam deferred due to type of encounter  Imaging: No results found.  Assessment and Plan:  Pregnancy: G3P2002 at [redacted]w[redacted]d 1. Supervision of other normal pregnancy, antepartum Stable GBS  negative  2. Abnormal glucose tolerance test (GTT) during pregnancy, antepartum Reports CBG's in goal range Will continue with diet. Growth scan next week  Term labor symptoms and general obstetric precautions including but not limited to vaginal bleeding, contractions, leaking of fluid and fetal movement were reviewed in detail with the patient. I discussed the assessment and treatment plan with the patient. The patient was provided an opportunity to ask questions and all were answered. The patient agreed with the plan and demonstrated an understanding of the instructions. The patient was advised to call back or seek an in-person office evaluation/go to MAU at [facility] for any urgent or concerning symptoms. Please refer to After Visit Summary for other counseling recommendations.   I provided 8 minutes of face-to-face time during this encounter.  Return in about 1 week (around 06/15/2019) for OB visit, virtual, MD provider.  Future Appointments  Date Time Provider Department Center  06/17/2019  3:15 PM [facility] [place] 4 [facility]    Hermina Staggers, MD Center for [facility]

## 2019-06-08 NOTE — Progress Notes (Signed)
I connected with  Zoe Smith on 06/08/19 by a video enabled telemedicine application and verified that I am speaking with the correct person using two identifiers.   I discussed the limitations of evaluation and management by telemedicine. The patient expressed understanding and agreed to proceed.  MyChart OB  C/o back pain 8/10 x 1 week, leg cramps.

## 2019-06-14 ENCOUNTER — Telehealth (INDEPENDENT_AMBULATORY_CARE_PROVIDER_SITE_OTHER): Payer: Medicaid Other | Admitting: Obstetrics & Gynecology

## 2019-06-14 VITALS — BP 104/63 | HR 77

## 2019-06-14 DIAGNOSIS — Z3480 Encounter for supervision of other normal pregnancy, unspecified trimester: Secondary | ICD-10-CM | POA: Diagnosis not present

## 2019-06-14 DIAGNOSIS — O99810 Abnormal glucose complicating pregnancy: Secondary | ICD-10-CM

## 2019-06-14 NOTE — Patient Instructions (Signed)

## 2019-06-14 NOTE — Progress Notes (Signed)
S/w pt for virtual visit. Pt reports fetal movement denies pain. Pt reports BG this morning was 84.

## 2019-06-14 NOTE — Progress Notes (Signed)
   TELEHEALTH VIRTUAL OBSTETRICS VISIT ENCOUNTER NOTE  I connected with Zoe Smith on 06/14/19 at 11:15 AM EDT by telephone at home and verified that I am speaking with the correct person using two identifiers.   I discussed the limitations, risks, security and privacy concerns of performing an evaluation and management service by telephone and the availability of in person appointments. I also discussed with the patient that there may be a patient responsible charge related to this service. The patient expressed understanding and agreed to proceed.  Subjective:  Zoe Smith is a 34 y.o. G3P2002 at [redacted]w[redacted]d being followed for ongoing prenatal care.  She is currently monitored for the following issues for this high-risk pregnancy and has Supervision of other normal pregnancy, antepartum and Abnormal glucose tolerance test (GTT) during pregnancy, antepartum on their problem list.  Patient reports no complaints. Reports fetal movement. Denies any contractions, bleeding or leaking of fluid.   The following portions of the patient's history were reviewed and updated as appropriate: allergies, current medications, past family history, past medical history, past social history, past surgical history and problem list.   Objective:   General:  Alert, oriented and cooperative.   Mental Status: Normal mood and affect perceived. Normal judgment and thought content.  Rest of physical exam deferred due to type of encounter  Labor precautions given  2. Abnormal glucose tolerance test (GTT) during pregnancy, antepartum FBS 80's and PP <105  Term labor symptoms and general obstetric precautions including but not limited to vaginal bleeding, contractions, leaking of fluid and fetal movement were reviewed in detail with the patient.  I discussed the assessment and treatment plan with the patient. The patient was provided an opportunity to ask questions and all were answered. The patient agreed  with the plan and demonstrated an understanding of the instructions. The patient was advised to call back or seek an in-person office evaluation/go to MAU at [facility] for any urgent or concerning symptoms. Please refer to After Visit Summary for other counseling recommendations.   I provided 11 minutes of non-face-to-face time during this encounter.  Return in about 1 week (around 06/21/2019) for in person.  Future Appointments  Date Time Provider Department Center  06/14/2019 11:15 AM Adam Phenix, MD [facility] None  06/17/2019  3:15 PM [facility]    Scheryl Darter, MD Center for [facility], [facility]

## 2019-06-16 ENCOUNTER — Telehealth: Payer: Medicaid Other | Admitting: Obstetrics & Gynecology

## 2019-06-17 ENCOUNTER — Other Ambulatory Visit: Payer: Self-pay

## 2019-06-17 ENCOUNTER — Ambulatory Visit: Payer: Medicaid Other | Attending: Obstetrics & Gynecology

## 2019-06-17 DIAGNOSIS — O99810 Abnormal glucose complicating pregnancy: Secondary | ICD-10-CM

## 2019-06-17 DIAGNOSIS — Z3A38 38 weeks gestation of pregnancy: Secondary | ICD-10-CM

## 2019-06-17 DIAGNOSIS — O2693 Pregnancy related conditions, unspecified, third trimester: Secondary | ICD-10-CM | POA: Diagnosis not present

## 2019-06-22 ENCOUNTER — Other Ambulatory Visit: Payer: Self-pay

## 2019-06-22 ENCOUNTER — Encounter (HOSPITAL_COMMUNITY): Payer: Self-pay | Admitting: *Deleted

## 2019-06-22 ENCOUNTER — Encounter: Payer: Self-pay | Admitting: Obstetrics and Gynecology

## 2019-06-22 ENCOUNTER — Telehealth (HOSPITAL_COMMUNITY): Payer: Self-pay | Admitting: *Deleted

## 2019-06-22 ENCOUNTER — Ambulatory Visit (INDEPENDENT_AMBULATORY_CARE_PROVIDER_SITE_OTHER): Payer: Medicaid Other | Admitting: Obstetrics and Gynecology

## 2019-06-22 VITALS — BP 108/67 | HR 88 | Wt 143.0 lb

## 2019-06-22 DIAGNOSIS — Z3480 Encounter for supervision of other normal pregnancy, unspecified trimester: Secondary | ICD-10-CM

## 2019-06-22 DIAGNOSIS — O99810 Abnormal glucose complicating pregnancy: Secondary | ICD-10-CM

## 2019-06-22 MED ORDER — BREAST PUMP MISC: 1.0000 | Freq: Once | 0 refills | Status: AC

## 2019-06-22 NOTE — Addendum Note (Signed)
Addended by: Marya Landry D on: 06/22/2019 12:03 PM   Modules accepted: Orders

## 2019-06-22 NOTE — Progress Notes (Signed)
   PRENATAL VISIT NOTE  Subjective:  WILDER AMODEI is a 34 y.o. G3P2002 at [redacted]w[redacted]d being seen today for ongoing prenatal care.  She is currently monitored for the following issues for this high-risk pregnancy and has Supervision of other normal pregnancy, antepartum and Abnormal glucose tolerance test (GTT) during pregnancy, antepartum on their problem list.  Patient reports no complaints.  Contractions: Not present. Vag. Bleeding: None.  Movement: Present. Denies leaking of fluid.   The following portions of the patient's history were reviewed and updated as appropriate: allergies, current medications, past family history, past medical history, past social history, past surgical history and problem list.   Objective:   Vitals:   06/22/19 1122  BP: 108/67  Pulse: 88  Weight: 143 lb (64.9 kg)    Fetal Status: Fetal Heart Rate (bpm): 135   Movement: Present     General:  Alert, oriented and cooperative. Patient is in no acute distress.  Skin: Skin is warm and dry. No rash noted.   Cardiovascular: Normal heart rate noted  Respiratory: Normal respiratory effort, no problems with respiration noted  Abdomen: Soft, gravid, appropriate for gestational age.  Pain/Pressure: Present     Pelvic: Cervical exam performed in the presence of a chaperone      2/50/-3  Extremities: Normal range of motion.     Mental Status: Normal mood and affect. Normal behavior. Normal judgment and thought content.   Assessment and Plan:  Pregnancy: G3P2002 at [redacted]w[redacted]d  1. Supervision of other normal pregnancy, antepartum Reviewed induction, induction scheduled Orders placed  2. Abnormal glucose tolerance test (GTT) during pregnancy, antepartum GDM, diet control  Term labor symptoms and general obstetric precautions including but not limited to vaginal bleeding, contractions, leaking of fluid and fetal movement were reviewed in detail with the patient. Please refer to After Visit Summary for other counseling  recommendations.   Return in about 1 week (around 06/29/2019) for high OB, in person.  No future appointments.  Conan Bowens, MD

## 2019-06-22 NOTE — Progress Notes (Signed)
Pt is doing well.  States her sugars have been within range. Pt would like Cervix check today.

## 2019-06-22 NOTE — Telephone Encounter (Signed)
Preadmission screen  

## 2019-06-22 NOTE — Progress Notes (Signed)
Induction Assessment Scheduling Form: Fax to [facility]:  [phone] Route to [facility] Labor Delivery   Zoe Smith                                                                                   DOB:  Nov 20, 1985                                                            MRN:  [id number]  Phone:  Home Phone [phone]  Mobile [phone]    Provider:  [facility] (Faculty [facility])  Admission Date/Time:  07/01/19 GP:  [id number]     Gestational age on admission:  40w                                                Estimated Date of Delivery: 07/01/19  Dating Criteria: LMP  Filed Weights   06/22/19 1122  Weight: 143 lb (64.9 kg)    GBS: Negative/-- (04/21 1131) HIV:  Non Reactive (02/25 1001)  Medical Indications for induction:  GDMA1 Scheduling Provider Signature:  Conan Bowens, MD         Method of induction(proposed):  Pitocin   Scheduling Provider Signature:  Conan Bowens, MD                                            Today's Date:  06/22/2019

## 2019-06-22 NOTE — Progress Notes (Signed)
Induction Assessment Scheduling Form: Fax to [facility]:  [phone] Route to [facility] Labor Delivery   Zoe Smith                                                                                   DOB:  06/05/1985                                                            MRN:  [id number]  Phone:  Home Phone [phone]  Mobile [phone]    Provider:  [facility] ([facility]ulty [facility])  Admission Date/Time:  07/01/19 GP:  [id number]     Gestational age on admission:  61                                                Estimated Date of Delivery: 07/01/19  Dating Criteria: LMP  Filed Weights   06/22/19 1122  Weight: 143 lb (64.9 kg)    GBS: Negative/-- (04/21 1131) HIV:  Non Reactive (02/25 1001)  Medical Indications for induction:  GDM Scheduling Provider Signature:  Marya Landry Dell[place], CMA         Method of induction(proposed):  Cytotec   Scheduling Provider Signature:  Marya Landry [place], [place]                                            Today's Date:  06/22/2019

## 2019-06-24 ENCOUNTER — Encounter (HOSPITAL_COMMUNITY): Payer: Self-pay | Admitting: Obstetrics and Gynecology

## 2019-06-24 ENCOUNTER — Other Ambulatory Visit: Payer: Self-pay | Admitting: Advanced Practice Midwife

## 2019-06-24 ENCOUNTER — Inpatient Hospital Stay (HOSPITAL_COMMUNITY)
Admission: AD | Admit: 2019-06-24 | Discharge: 2019-06-24 | Disposition: A | Discharge status: Home / Self Care | Payer: Commercial Managed Care - PPO | Attending: Obstetrics and Gynecology | Admitting: Obstetrics and Gynecology

## 2019-06-24 DIAGNOSIS — Z791 Long term (current) use of non-steroidal anti-inflammatories (NSAID): Secondary | ICD-10-CM | POA: Insufficient documentation

## 2019-06-24 DIAGNOSIS — Z3A39 39 weeks gestation of pregnancy: Secondary | ICD-10-CM | POA: Diagnosis not present

## 2019-06-24 DIAGNOSIS — O26893 Other specified pregnancy related conditions, third trimester: Secondary | ICD-10-CM

## 2019-06-24 DIAGNOSIS — N898 Other specified noninflammatory disorders of vagina: Secondary | ICD-10-CM | POA: Diagnosis not present

## 2019-06-24 DIAGNOSIS — Z79899 Other long term (current) drug therapy: Secondary | ICD-10-CM | POA: Insufficient documentation

## 2019-06-24 DIAGNOSIS — Z3689 Encounter for other specified antenatal screening: Secondary | ICD-10-CM

## 2019-06-24 LAB — POCT FERN TEST

## 2019-06-24 NOTE — Discharge Instructions (Signed)
Labor and Vaginal Delivery  Vaginal delivery means that you give birth by pushing your baby out of your birth canal (vagina). A team of health care providers will help you before, during, and after vaginal delivery. Birth experiences are unique for every woman and every pregnancy, and birth experiences vary depending on where you choose to give birth. What happens when I arrive at the birth center or hospital? Once you are in labor and have been admitted into the hospital or birth center, your health care provider may:  Review your pregnancy history and any concerns that you have.  Insert an IV into one of your veins. This may be used to give you fluids and medicines.  Check your blood pressure, pulse, temperature, and heart rate (vital signs).  Check whether your bag of water (amniotic sac) has broken (ruptured).  Talk with you about your birth plan and discuss pain control options. Monitoring Your health care provider may monitor your contractions (uterine monitoring) and your baby's heart rate (fetal monitoring). You may need to be monitored:  Often, but not continuously (intermittently).  All the time or for long periods at a time (continuously). Continuous monitoring may be needed if: ? You are taking certain medicines, such as medicine to relieve pain or make your contractions stronger. ? You have pregnancy or labor complications. Monitoring may be done by:  Placing a special stethoscope or a handheld monitoring device on your abdomen to check your baby's heartbeat and to check for contractions.  Placing monitors on your abdomen (external monitors) to record your baby's heartbeat and the frequency and length of contractions.  Placing monitors inside your uterus through your vagina (internal monitors) to record your baby's heartbeat and the frequency, length, and strength of your contractions. Depending on the type of monitor, it may remain in your uterus or on your baby's head  until birth.  Telemetry. This is a type of continuous monitoring that can be done with external or internal monitors. Instead of having to stay in bed, you are able to move around during telemetry. Physical exam Your health care provider may perform frequent physical exams. This may include:  Checking how and where your baby is positioned in your uterus.  Checking your cervix to determine: ? Whether it is thinning out (effacing). ? Whether it is opening up (dilating). What happens during labor and delivery?  Normal labor and delivery is divided into the following three stages: Stage 1  This is the longest stage of labor.  This stage can last for hours or days.  Throughout this stage, you will feel contractions. Contractions generally feel mild, infrequent, and irregular at first. They get stronger, more frequent (about every 2-3 minutes), and more regular as you move through this stage.  This stage ends when your cervix is completely dilated to 4 inches (10 cm) and completely effaced. Stage 2  This stage starts once your cervix is completely effaced and dilated and lasts until the delivery of your baby.  This stage may last from 20 minutes to 2 hours.  This is the stage where you will feel an urge to push your baby out of your vagina.  You may feel stretching and burning pain, especially when the widest part of your baby's head passes through the vaginal opening (crowning).  Once your baby is delivered, the umbilical cord will be clamped and cut. This usually occurs after waiting a period of 1-2 minutes after delivery.  Your baby will be placed on your   bare chest (skin-to-skin contact) in an upright position and covered with a warm blanket. Watch your baby for feeding cues, like rooting or sucking, and help the baby to your breast for his or her first feeding. Stage 3  This stage starts immediately after the birth of your baby and ends after you deliver the placenta.  This  stage may take anywhere from 5 to 30 minutes.  After your baby has been delivered, you will feel contractions as your body expels the placenta and your uterus contracts to control bleeding. What can I expect after labor and delivery?  After labor is over, you and your baby will be monitored closely until you are ready to go home to ensure that you are both healthy. Your health care team will teach you how to care for yourself and your baby.  You and your baby will stay in the same room (rooming in) during your hospital stay. This will encourage early bonding and successful breastfeeding.  You may continue to receive fluids and medicines through an IV.  Your uterus will be checked and massaged regularly (fundal massage).  You will have some soreness and pain in your abdomen, vagina, and the area of skin between your vaginal opening and your anus (perineum).  If an incision was made near your vagina (episiotomy) or if you had some vaginal tearing during delivery, cold compresses may be placed on your episiotomy or your tear. This helps to reduce pain and swelling.  You may be given a squirt bottle to use instead of wiping when you go to the bathroom. To use the squirt bottle, follow these steps: ? Before you urinate, fill the squirt bottle with warm water. Do not use hot water. ? After you urinate, while you are sitting on the toilet, use the squirt bottle to rinse the area around your urethra and vaginal opening. This rinses away any urine and blood. ? Fill the squirt bottle with clean water every time you use the bathroom.  It is normal to have vaginal bleeding after delivery. Wear a sanitary pad for vaginal bleeding and discharge. Summary  Vaginal delivery means that you will give birth by pushing your baby out of your birth canal (vagina).  Your health care provider may monitor your contractions (uterine monitoring) and your baby's heart rate (fetal monitoring).  Your health care  provider may perform a physical exam.  Normal labor and delivery is divided into three stages.  After labor is over, you and your baby will be monitored closely until you are ready to go home. This information is not intended to replace advice given to you by your health care provider. Make sure you discuss any questions you have with your health care provider. Document Revised: 03/03/2017 Document Reviewed: 03/03/2017 Elsevier Patient Education  2020 Elsevier Inc[employer]  

## 2019-06-24 NOTE — MAU Note (Signed)
PT SAYS  SROM AT 0220.  PNC WITH [facility] -  VE ON WED  2 CM.

## 2019-06-24 NOTE — MAU Provider Note (Signed)
Chief Complaint:  Rupture of Membranes   First Provider Initiated Contact with Patient 06/24/19 0414     HPI: Zoe Smith is a 34 y.o. G3P2002 at 2w0dho presents to maternity admissions reporting gush of fluid "that kept coming".  States it was clear.  Wore a "Depends" in but it was not soaked. . She reports good fetal movement, denies vaginal bleeding, vaginal itching/burning, urinary symptoms, h/a, dizziness, n/v, diarrhea, constipation or fever/chills.  Vaginal Discharge The patient's primary symptoms include vaginal discharge. The patient's pertinent negatives include no genital itching, genital lesions, genital odor, pelvic pain or vaginal bleeding. This is a new problem. The current episode started today. The problem occurs intermittently. The patient is experiencing no pain. She is pregnant. Pertinent negatives include no abdominal pain, back pain, constipation, diarrhea, dysuria, fever or frequency. The vaginal discharge was clear and watery. There has been no bleeding. She has not been passing clots. She has not been passing tissue. Nothing aggravates the symptoms. She has tried nothing for the symptoms.    RN Note: PT SAYS  SROM AT 0220.  PNC WITH [facility] -  VE ON WED  2 CM.    Past Medical History: Past Medical History:  Diagnosis Date  . Gestational diabetes   . Medical history non-contributory   . Spontaneous vaginal delivery 08/17/2010    Past obstetric history: OB History  Gravida Para Term Preterm AB Living  3 2 2     2   SAB TAB Ectopic Multiple Live Births          2    # Outcome Date GA Lbr Len/2nd Weight Sex Delivery Anes PTL Lv  3 Current           2 Term 05/18/12 [id number] / 00:16 2909 g F Vag-Spont EPI  LIV  1 Term 08/15/10 [id number] g F Vag-Spont   LIV    Past Surgical History: Past Surgical History:  Procedure Laterality Date  . NO PAST SURGERIES      Family History: Family History  Problem Relation Age of Onset  . Cancer Mother   .  Hypertension Father     Social History: Social History   Tobacco Use  . Smoking status: Never Smoker  . Smokeless tobacco: Never Used  Substance Use Topics  . Alcohol use: No  . Drug use: No    Allergies: No Known Allergies  Meds:  Medications Prior to Admission  Medication Sig Dispense Refill Last Dose  . Accu-Chek Softclix Lancets lancets 1 each by Other route 4 (four) times daily. 100 each 12   . Blood Glucose Monitoring Suppl (ACCU-CHEK GUIDE) w/Device KIT 1 Device by Does not apply route 4 (four) times daily. 1 kit 0   . Blood Pressure Monitoring (BLOOD PRESSURE CUFF) MISC 1 Device by Does not apply route once a week. 1 each 0   . Blood Pressure Monitoring (BLOOD PRESSURE KIT) DEVI 1 kit by Does not apply route as needed. 1 each 0   . ferrous sulfate 325 (65 FE) MG tablet Take 1 tablet (325 mg total) by mouth 2 (two) times daily with a meal. 60 tablet 5   . glucose blood (ACCU-CHEK GUIDE) test strip Use to check blood sugars four times a day was instructed 50 each 12   . ibuprofen (ADVIL,MOTRIN) 600 MG tablet Take 1 tablet (600 mg total) by mouth every 6 (six) hours. (Patient not taking: Reported on 12/24/2018) 30 tablet 1   . omega-3 acid ethyl  esters (LOVAZA) 1 g capsule Take by mouth 2 (two) times daily.     . Prenatal Vit-Fe Fumarate-FA (PRENATAL MULTIVITAMIN) TABS Take 1 tablet by mouth daily at 12 noon.       I have reviewed patient's Past Medical Hx, Surgical Hx, Family Hx, Social Hx, medications and allergies.   ROS:  Review of Systems  Constitutional: Negative for fever.  Gastrointestinal: Negative for abdominal pain, constipation and diarrhea.  Genitourinary: Positive for vaginal discharge. Negative for dysuria, frequency and pelvic pain.  Musculoskeletal: Negative for back pain.   Other systems negative  Physical Exam   Patient Vitals for the past 24 hrs:  BP Temp Temp src Pulse Resp Height Weight  06/24/19 0358 114/79 98.1 F (36.7 C) Oral 90 16 -- --   06/24/19 0353 -- -- -- -- -- 5' 2"  (1.575 m) 65.6 kg   Constitutional: Well-developed, well-nourished female in no acute distress.  Cardiovascular: normal rate and rhythm Respiratory: normal effort, clear to auscultation bilaterally GI: Abd soft, non-tender, gravid appropriate for gestational age.   No rebound or guarding. MS: Extremities nontender, no edema, normal ROM Neurologic: Alert and oriented x 4.  GU: Neg CVAT.  PELVIC EXAM: Cervix pink, visually closed, without lesion, scant white creamy discharge, vaginal walls and external genitalia normal Dilation: 2.5 Effacement (%): 60, 70 Station: -2 Presentation: Vertex Exam by:: Glenice Bow, rnc    FHT:  Baseline 130 , moderate variability, accelerations present, no decelerations Contractions:  Irregular     Labs: Results for orders placed or performed during the hospital encounter of 06/24/19 (from the past 24 hour(s))  POCT fern test     Status: Normal   Collection Time: 06/24/19  4:10 AM  Result Value Ref Range   POCT Fern Test     O/Positive/-- (11/13 1104)  Imaging:    MAU Course/MDM: Sterile speculum exam showed no pooling at all in vault.  Fern negative NST reviewed, initially equivocally reactive with one accel then more accels later. Overall category I  Treatments in MAU included EFM, sterile speculum exam.    Assessment: Single IUP at 22w0dVaginal discharge, no evidence of ruptured membranes  Plan: Discharge home Labor precautions and fetal kick counts Follow up in Office for prenatal visits   Encouraged to return here or to other Urgent Care/ED if she develops worsening of symptoms, increase in pain, fever, or other concerning symptoms.  Pt stable at time of discharge.  MHansel FeinsteinCNM, MSN Certified Nurse-Midwife 06/24/2019 4:14 AM

## 2019-06-27 ENCOUNTER — Inpatient Hospital Stay (HOSPITAL_COMMUNITY)
Admission: AD | Admit: 2019-06-27 | Discharge: 2019-06-30 | DRG: 807 | Disposition: A | Discharge status: Home / Self Care | Payer: Commercial Managed Care - PPO | Attending: Obstetrics and Gynecology | Admitting: Obstetrics and Gynecology

## 2019-06-27 ENCOUNTER — Inpatient Hospital Stay (HOSPITAL_COMMUNITY): Payer: Commercial Managed Care - PPO | Admitting: Anesthesiology

## 2019-06-27 ENCOUNTER — Encounter (HOSPITAL_COMMUNITY): Payer: Self-pay | Admitting: Obstetrics and Gynecology

## 2019-06-27 ENCOUNTER — Other Ambulatory Visit: Payer: Self-pay

## 2019-06-27 DIAGNOSIS — O24419 Gestational diabetes mellitus in pregnancy, unspecified control: Secondary | ICD-10-CM | POA: Diagnosis not present

## 2019-06-27 DIAGNOSIS — Z3480 Encounter for supervision of other normal pregnancy, unspecified trimester: Secondary | ICD-10-CM

## 2019-06-27 DIAGNOSIS — Z3A39 39 weeks gestation of pregnancy: Secondary | ICD-10-CM | POA: Diagnosis not present

## 2019-06-27 DIAGNOSIS — Z20822 Contact with and (suspected) exposure to covid-19: Secondary | ICD-10-CM | POA: Diagnosis present

## 2019-06-27 DIAGNOSIS — D649 Anemia, unspecified: Secondary | ICD-10-CM | POA: Diagnosis not present

## 2019-06-27 DIAGNOSIS — O99810 Abnormal glucose complicating pregnancy: Secondary | ICD-10-CM | POA: Diagnosis present

## 2019-06-27 DIAGNOSIS — Z3689 Encounter for other specified antenatal screening: Secondary | ICD-10-CM

## 2019-06-27 DIAGNOSIS — O9902 Anemia complicating childbirth: Secondary | ICD-10-CM | POA: Diagnosis present

## 2019-06-27 DIAGNOSIS — O24420 Gestational diabetes mellitus in childbirth, diet controlled: Secondary | ICD-10-CM | POA: Diagnosis present

## 2019-06-27 DIAGNOSIS — O479 False labor, unspecified: Secondary | ICD-10-CM

## 2019-06-27 DIAGNOSIS — O99013 Anemia complicating pregnancy, third trimester: Secondary | ICD-10-CM | POA: Diagnosis not present

## 2019-06-27 DIAGNOSIS — O24429 Gestational diabetes mellitus in childbirth, unspecified control: Secondary | ICD-10-CM | POA: Diagnosis not present

## 2019-06-27 DIAGNOSIS — O26893 Other specified pregnancy related conditions, third trimester: Secondary | ICD-10-CM | POA: Diagnosis present

## 2019-06-27 LAB — TYPE AND SCREEN
ABO/RH(D): O POS
Antibody Screen: NEGATIVE

## 2019-06-27 LAB — CBC
HCT: 37.4 % (ref 36.0–46.0)
Hemoglobin: 11.9 g/dL — ABNORMAL LOW (ref 12.0–15.0)
MCH: 27.6 pg (ref 26.0–34.0)
MCHC: 31.8 g/dL (ref 30.0–36.0)
MCV: 86.8 fL (ref 80.0–100.0)
Platelets: 222 10*3/uL (ref 150–400)
RBC: 4.31 MIL/uL (ref 3.87–5.11)
RDW: 15.9 % — ABNORMAL HIGH (ref 11.5–15.5)
WBC: 8.8 10*3/uL (ref 4.0–10.5)
nRBC: 0 % (ref 0.0–0.2)

## 2019-06-27 LAB — ABO/RH: ABO/RH(D): O POS

## 2019-06-27 LAB — SARS CORONAVIRUS 2 BY RT PCR (HOSPITAL ORDER, PERFORMED IN ~~LOC~~ HOSPITAL LAB): SARS Coronavirus 2: NEGATIVE

## 2019-06-27 LAB — GLUCOSE, CAPILLARY: Glucose-Capillary: 79 mg/dL (ref 70–99)

## 2019-06-27 MED ORDER — LIDOCAINE HCL (PF) 1 % IJ SOLN: 30.0000 mL | INTRAMUSCULAR | Status: DC | PRN

## 2019-06-27 MED ORDER — TERBUTALINE SULFATE 1 MG/ML IJ SOLN: 0.2500 mg | Freq: Once | INTRAMUSCULAR | Status: DC | PRN

## 2019-06-27 MED ORDER — PHENYLEPHRINE 40 MCG/ML (10ML) SYRINGE FOR IV PUSH (FOR BLOOD PRESSURE SUPPORT): 80.0000 ug | PREFILLED_SYRINGE | INTRAVENOUS | Status: DC | PRN

## 2019-06-27 MED ORDER — LACTATED RINGERS IV SOLN: 500.0000 mL | Freq: Once | INTRAVENOUS | Status: DC

## 2019-06-27 MED ORDER — FENTANYL-BUPIVACAINE-NACL 0.5-0.125-0.9 MG/250ML-% EP SOLN
12.0000 mL/h | EPIDURAL | Status: DC | PRN
  Filled 2019-06-27: qty 250

## 2019-06-27 MED ORDER — OXYCODONE-ACETAMINOPHEN 5-325 MG PO TABS: 2.0000 | ORAL_TABLET | ORAL | Status: DC | PRN

## 2019-06-27 MED ORDER — SOD CITRATE-CITRIC ACID 500-334 MG/5ML PO SOLN: 30.0000 mL | ORAL | Status: DC | PRN

## 2019-06-27 MED ORDER — OXYTOCIN 40 UNITS IN NORMAL SALINE INFUSION - SIMPLE MED
2.5000 [IU]/h | INTRAVENOUS | Status: DC
  Filled 2019-06-27: qty 1000

## 2019-06-27 MED ORDER — ONDANSETRON HCL 4 MG/2ML IJ SOLN: 4.0000 mg | Freq: Four times a day (QID) | INTRAMUSCULAR | Status: DC | PRN

## 2019-06-27 MED ORDER — LIDOCAINE HCL (PF) 1 % IJ SOLN
INTRAMUSCULAR | Status: DC | PRN
  Administered 2019-06-27 (×2): 4 mL via EPIDURAL

## 2019-06-27 MED ORDER — FENTANYL CITRATE (PF) 100 MCG/2ML IJ SOLN
100.0000 ug | INTRAMUSCULAR | Status: DC | PRN
  Administered 2019-06-27: 100 ug via INTRAVENOUS
  Filled 2019-06-27: qty 2

## 2019-06-27 MED ORDER — FERROUS SULFATE 325 (65 FE) MG PO TABS: 325.0000 mg | ORAL_TABLET | Freq: Two times a day (BID) | ORAL | Status: DC

## 2019-06-27 MED ORDER — PRENATAL MULTIVITAMIN CH: 1.0000 | ORAL_TABLET | Freq: Every day | ORAL | Status: DC

## 2019-06-27 MED ORDER — OXYCODONE-ACETAMINOPHEN 5-325 MG PO TABS: 1.0000 | ORAL_TABLET | ORAL | Status: DC | PRN

## 2019-06-27 MED ORDER — ACETAMINOPHEN 325 MG PO TABS: 650.0000 mg | ORAL_TABLET | ORAL | Status: DC | PRN

## 2019-06-27 MED ORDER — LACTATED RINGERS IV SOLN: INTRAVENOUS | Status: DC

## 2019-06-27 MED ORDER — LACTATED RINGERS IV SOLN: 500.0000 mL | INTRAVENOUS | Status: DC | PRN

## 2019-06-27 MED ORDER — EPHEDRINE 5 MG/ML INJ: 10.0000 mg | INTRAVENOUS | Status: DC | PRN

## 2019-06-27 MED ORDER — DIPHENHYDRAMINE HCL 50 MG/ML IJ SOLN: 12.5000 mg | INTRAMUSCULAR | Status: DC | PRN

## 2019-06-27 MED ORDER — OXYTOCIN 40 UNITS IN NORMAL SALINE INFUSION - SIMPLE MED: 1.0000 m[IU]/min | INTRAVENOUS | Status: DC

## 2019-06-27 MED ORDER — OXYTOCIN BOLUS FROM INFUSION
500.0000 mL | Freq: Once | INTRAVENOUS | Status: AC
  Administered 2019-06-28: 500 mL via INTRAVENOUS

## 2019-06-27 MED ORDER — SODIUM CHLORIDE (PF) 0.9 % IJ SOLN
INTRAMUSCULAR | Status: DC | PRN
  Administered 2019-06-27: 12 mL/h via EPIDURAL

## 2019-06-27 NOTE — H&P (Addendum)
OBSTETRIC ADMISSION HISTORY AND PHYSICAL  Zoe Smith is a 34 y.o. female G3P2002 with IUP at 8w3dby LMP presenting for spontaneous onset of labor. She reports +FMs, No LOF, no VB, no blurry vision, headaches or peripheral edema, and RUQ pain.  She plans on breast feeding. She plans on using condoms for birth control. [facility] --->  Estimated Date of Delivery: 07/01/19  Sono:  06/17/2019  @[redacted]w[redacted]d , CWD, normal anatomy, cephalic presentation, anterior placenta, 3197g, 46% EFW   Nursing Staff Provider  Office Location  [facility] Dating  LMP  Language  English Anatomy UKorea 03-11-19  Flu Vaccine  Received 12/24/18 Genetic Screen  NIPS: Low risk  AFP:   First Screen:  Quad:    TDaP vaccine   04-07-19 Hgb A1C or  GTT Early  Third trimester abnormal  Rhogam     LAB RESULTS   Feeding Plan Both Blood Type O/Positive/-- (11/13 1104)   Contraception  none Antibody Negative (11/13 1104)  Circumcision Yes if female Rubella 1.48 (11/13 1104)  Pediatrician   [facility] RPR Non Reactive (11/13 1104)   Support Person RKyung Rudd(husband) HBsAg Negative (11/13 1104)   Prenatal Classes none HIV Non Reactive (11/13 1104)  BTL Consent  GBS  negative  VBAC Consent  Pap     Hgb Electro    BP Cuff Re-sent 01-21-19 CF     SMA     Waterbirth  [ ]  Class [ ]  Consent [ ]  CNM visit     Prenatal History/Complications: GDMA1, anemia of pregnancy  Past Medical History: Past Medical History:  Diagnosis Date  . Gestational diabetes   . Medical history non-contributory   . Spontaneous vaginal delivery 08/17/2010    Past Surgical History: Past Surgical History:  Procedure Laterality Date  . NO PAST SURGERIES      Obstetrical History: OB History    Gravida  3   Para  2   Term  2   Preterm      AB      Living  2     SAB      TAB      Ectopic      Multiple      Live Births  2           Social History Social History    Socioeconomic History  . Marital status: Married    Spouse name: Not on file  . Number of children: Not on file  . Years of education: Not on file  . Highest education level: Not on file  Occupational History  . Not on file  Tobacco Use  . Smoking status: Never Smoker  . Smokeless tobacco: Never Used  Substance and Sexual Activity  . Alcohol use: No  . Drug use: No  . Sexual activity: Yes    Birth control/protection: None  Other Topics Concern  . Not on file  Social History Narrative  . Not on file   Social Determinants of Health   Financial Resource Strain:   . Difficulty of Paying Living Expenses:   Food Insecurity:   . Worried About RCharity fundraiserin the Last Year:   . RArboriculturistin the Last Year:   Transportation Needs:   . LFilm/video editor(Medical):   .Marland KitchenLack of Transportation (Non-Medical):   Physical Activity:   . Days of Exercise per Week:   . Minutes of  Exercise per Session:   Stress:   . Feeling of Stress :   Social Connections:   . Frequency of Communication with Friends and Family:   . Frequency of Social Gatherings with Friends and Family:   . Attends Religious Services:   . Active Member of Clubs or Organizations:   . Attends Archivist Meetings:   Marland Kitchen Marital Status:     Family History: Family History  Problem Relation Age of Onset  . Cancer Mother   . Hypertension Father     Allergies: No Known Allergies  Medications Prior to Admission  Medication Sig Dispense Refill Last Dose  . Accu-Chek Softclix Lancets lancets 1 each by Other route 4 (four) times daily. 100 each 12 06/26/2019 at Unknown time  . Blood Glucose Monitoring Suppl (ACCU-CHEK GUIDE) w/Device KIT 1 Device by Does not apply route 4 (four) times daily. 1 kit 0 06/26/2019 at Unknown time  . Blood Pressure Monitoring (BLOOD PRESSURE CUFF) MISC 1 Device by Does not apply route once a week. 1 each 0 06/26/2019 at Unknown time  . Blood Pressure Monitoring  (BLOOD PRESSURE KIT) DEVI 1 kit by Does not apply route as needed. 1 each 0 Past Week at Unknown time  . ferrous sulfate 325 (65 FE) MG tablet Take 1 tablet (325 mg total) by mouth 2 (two) times daily with a meal. 60 tablet 5 06/26/2019 at Unknown time  . glucose blood (ACCU-CHEK GUIDE) test strip Use to check blood sugars four times a day was instructed 50 each 12 06/26/2019 at Unknown time  . omega-3 acid ethyl esters (LOVAZA) 1 g capsule Take by mouth 2 (two) times daily.   06/26/2019 at Unknown time  . Prenatal Vit-Fe Fumarate-FA (PRENATAL MULTIVITAMIN) TABS Take 1 tablet by mouth daily at 12 noon.   06/26/2019 at Unknown time     Review of Systems   All systems reviewed and negative except as stated in HPI  Blood pressure 115/72, pulse 96, temperature 98.6 F (37 C), temperature source Oral, resp. rate 15, height 5' 2"  (1.575 m), weight 66.2 kg, last menstrual period 09/24/2018, SpO2 98 %, unknown if currently breastfeeding. General appearance: alert and cooperative Lungs: normal breath sounds Heart: not examined, will recheck on next examination Abdomen: soft, gravid Pelvic: not performed Extremities: No edema, no sign of DVT Presentation: cephalic by Leopold's Fetal monitoring 140 bpm/moderate variability/+ accels, 1 early decel Uterine activity Date/time of onset: 06/27/2019 around 1530, q2-7mns  Dilation: 5 Effacement (%): 80 Cervical Position: Middle Station: -2 Presentation: Vertex Exam by:: K.Wilosn,RN at 2105  Prenatal labs: ABO, Rh: --/--/O POS, PENDING (05/17 2139) Antibody: NEG (05/17 2139) Rubella: 1.48 (11/13 1104) RPR: Non Reactive (02/25 1001)  HBsAg: Negative (11/13 1104)  HIV: Non Reactive (02/25 1001)  GBS: Negative/-- (04/21 1131)  2 hr Glucola 156 Genetic screening  NIPS low risk Anatomy UKoreanormal  Prenatal Transfer Tool  Maternal Diabetes: Yes:  Diabetes Type:  Diet controlled Genetic Screening: Normal Maternal Ultrasounds/Referrals: Normal Fetal  Ultrasounds or other Referrals:  None Maternal Substance Abuse:  No Significant Maternal Medications:  None Significant Maternal Lab Results: None  Results for orders placed or performed during the hospital encounter of 06/27/19 (from the past 24 hour(s))  CBC   Collection Time: 06/27/19  9:39 PM  Result Value Ref Range   WBC 8.8 4.0 - 10.5 K/uL   RBC 4.31 3.87 - 5.11 MIL/uL   Hemoglobin 11.9 (L) 12.0 - 15.0 g/dL   HCT 37.4 36.0 -  46.0 %   MCV 86.8 80.0 - 100.0 fL   MCH 27.6 26.0 - 34.0 pg   MCHC 31.8 30.0 - 36.0 g/dL   RDW 15.9 (H) 11.5 - 15.5 %   Platelets 222 150 - 400 K/uL   nRBC 0.0 0.0 - 0.2 %  Type and screen   Collection Time: 06/27/19  9:39 PM  Result Value Ref Range   ABO/RH(D) O POS    Antibody Screen NEG    Sample Expiration      06/30/2019,2359 Performed at [facility], [place] [address] [place], [place] [address]   ABO/Rh   Collection Time: 06/27/19  9:39 PM  Result Value Ref Range   ABO/RH(D) PENDING     Patient Active Problem List   Diagnosis Date Noted  . Labor and delivery, indication for care 06/27/2019  . Gestational diabetes 06/27/2019  . Abnormal glucose tolerance test (GTT) during pregnancy, antepartum 05/07/2019  . Supervision of other normal pregnancy, antepartum 12/24/2018    Assessment/Plan:  Zoe Smith is a 34 y.o. G3P2002 at 28w3dwith pregnancy complicated with GDMA1 here for spontaneous onset of labor.  #Labor: [facility]  Routine Labor and Delivery Orders per protocol.  Has no questions at this time.  Will hold off on augmentation. #Pain: Fentanyl prn, Epidural if desired. #FWB: Category 1 tracing #ID: GBS negative #MOF: Breast #MOC: Condoms #Circ:  N/A, girl #GDMA1: CBGs q4h #Anemia of Pregnancy: continue ferrous sulfate, CBC on admission   BCleophas Dunker DO  06/27/2019, 10:36 PM   OB FELLOW ATTESTATION  I have seen and examined this patient and edited the above documentation in the  resident's note to reflect any changes or updates.  MAugustin Coupe MD/[facility] OB Fellow  06/27/2019, 11:41 PM

## 2019-06-27 NOTE — MAU Note (Signed)
.   Zoe Smith is a 34 y.o. at [redacted]w[redacted]d here in MAU reporting: regular painful ctx that started around 1530 this evening. She states that she was here Friday and her cervix was 2.5cm. No VB or LOF. Patient endorses good fetal movement.   Pain score: 6 Vitals:   06/27/19 1827  BP: 116/73  Pulse: (!) 110  Resp: 15  Temp: 98.6 F (37 C)  SpO2: 100%     FHT:148

## 2019-06-27 NOTE — Progress Notes (Signed)
Labor Progress Note MALINI FLEMINGS is a 34 y.o. G3P2002 at [redacted]w[redacted]d with GDMA1 presented for SOL  S: Reports feeling increasing pressure.  Improvement in pain with epidural.  Patient now reporting leakage of fluid.  Now plans to use condoms for contraception.  O:  BP 118/82   Pulse 93   Temp 98.5 F (36.9 C) (Oral)   Resp 15   Ht 5\' 2"  (1.575 m)   Wt 66.2 kg   LMP 09/24/2018 (Exact Date)   SpO2 100%   BMI 26.69 kg/m  EFM: 140 bpm/mod variability/+ accels, no decels  Physical Exam: Heart: RRR  CVE: Dilation: 8.5 Effacement (%): 90 Cervical Position: Middle Station: -1 Presentation: Vertex Exam by:: [email or web address] RN    A&P: 34 y.o. 34 [redacted]w[redacted]d with GDMA1, here for SOL. #Labor: Progressing well. Now with likely SROM.  Continue expectant management. #Pain: Epidural #FWB: Category I #GBS negative  #MOC: condoms #GDMA1: CBGs q4h  [redacted]w[redacted]d Mikeya Tomasetti, DO 11:40 PM

## 2019-06-27 NOTE — Anesthesia Procedure Notes (Signed)
Epidural Patient location during procedure: OB Start time: 06/27/2019 10:34 PM End time: 06/27/2019 10:37 PM  Staffing Anesthesiologist: Kaylyn Layer, MD Performed: anesthesiologist   Preanesthetic Checklist Completed: patient identified, IV checked, risks and benefits discussed, monitors and equipment checked, pre-op evaluation and timeout performed  Epidural Patient position: sitting Prep: DuraPrep and site prepped and draped Patient monitoring: continuous pulse ox, blood pressure and heart rate Approach: midline Location: L3-L4 Injection technique: LOR air  Needle:  Needle type: Tuohy  Needle gauge: 17 G Needle length: 9 cm Needle insertion depth: 5 cm Catheter type: closed end flexible Catheter size: 19 Gauge Catheter at skin depth: 10 cm Test dose: negative and Other (1% lidocaine)  Assessment Events: blood not aspirated, injection not painful, no injection resistance, no paresthesia and negative IV test  Additional Notes Patient identified. Risks, benefits, and alternatives discussed with patient including but not limited to bleeding, infection, nerve damage, paralysis, failed block, incomplete pain control, headache, blood pressure changes, nausea, vomiting, reactions to medication, itching, and postpartum back pain. Confirmed with bedside nurse the patient's most recent platelet count. Confirmed with patient that they are not currently taking any anticoagulation, have any bleeding history, or any family history of bleeding disorders. Patient expressed understanding and wished to proceed. All questions were answered. Sterile technique was used throughout the entire procedure. Please see nursing notes for vital signs.   Crisp LOR on first pass. Test dose was given through epidural catheter and negative prior to continuing to dose epidural or start infusion. Warning signs of high block given to the patient including shortness of breath, tingling/numbness in hands, complete  motor block, or any concerning symptoms with instructions to call for help. Patient was given instructions on fall risk and not to get out of bed. All questions and concerns addressed with instructions to call with any issues or inadequate analgesia.  Reason for block:procedure for pain

## 2019-06-27 NOTE — Anesthesia Preprocedure Evaluation (Signed)
Anesthesia Evaluation  Patient identified by MRN, date of birth, ID band Patient awake    Reviewed: Allergy & Precautions, Patient's Chart, lab work & pertinent test results  History of Anesthesia Complications Negative for: history of anesthetic complications  Airway Mallampati: II  TM Distance: >3 FB Neck ROM: Full    Dental no notable dental hx.    Pulmonary neg pulmonary ROS,    Pulmonary exam normal        Cardiovascular negative cardio ROS Normal cardiovascular exam     Neuro/Psych negative neurological ROS  negative psych ROS   GI/Hepatic negative GI ROS, Neg liver ROS,   Endo/Other  diabetes, Gestational  Renal/GU negative Renal ROS  negative genitourinary   Musculoskeletal negative musculoskeletal ROS (+)   Abdominal   Peds  Hematology negative hematology ROS (+)   Anesthesia Other Findings Day of surgery medications reviewed with patient.  Reproductive/Obstetrics (+) Pregnancy                             Anesthesia Physical Anesthesia Plan  ASA: II  Anesthesia Plan: Epidural   Post-op Pain Management:    Induction:   PONV Risk Score and Plan: Treatment may vary due to age or medical condition  Airway Management Planned: Natural Airway  Additional Equipment:   Intra-op Plan:   Post-operative Plan:   Informed Consent: I have reviewed the patients History and Physical, chart, labs and discussed the procedure including the risks, benefits and alternatives for the proposed anesthesia with the patient or authorized representative who has indicated his/her understanding and acceptance.       Plan Discussed with:   Anesthesia Plan Comments:         Anesthesia Quick Evaluation  

## 2019-06-28 ENCOUNTER — Encounter (HOSPITAL_COMMUNITY): Payer: Self-pay | Admitting: Obstetrics and Gynecology

## 2019-06-28 DIAGNOSIS — O99013 Anemia complicating pregnancy, third trimester: Secondary | ICD-10-CM

## 2019-06-28 DIAGNOSIS — O24419 Gestational diabetes mellitus in pregnancy, unspecified control: Secondary | ICD-10-CM

## 2019-06-28 DIAGNOSIS — D649 Anemia, unspecified: Secondary | ICD-10-CM

## 2019-06-28 LAB — RPR: RPR Ser Ql: NONREACTIVE

## 2019-06-28 LAB — CBC
HCT: 34.5 % — ABNORMAL LOW (ref 36.0–46.0)
Hemoglobin: 10.9 g/dL — ABNORMAL LOW (ref 12.0–15.0)
MCH: 27.7 pg (ref 26.0–34.0)
MCHC: 31.6 g/dL (ref 30.0–36.0)
MCV: 87.6 fL (ref 80.0–100.0)
Platelets: 208 10*3/uL (ref 150–400)
RBC: 3.94 MIL/uL (ref 3.87–5.11)
RDW: 15.9 % — ABNORMAL HIGH (ref 11.5–15.5)
WBC: 10.5 10*3/uL (ref 4.0–10.5)
nRBC: 0 % (ref 0.0–0.2)

## 2019-06-28 LAB — GLUCOSE, CAPILLARY
Glucose-Capillary: 71 mg/dL (ref 70–99)
Glucose-Capillary: 73 mg/dL (ref 70–99)

## 2019-06-28 MED ORDER — ACETAMINOPHEN 325 MG PO TABS
650.0000 mg | ORAL_TABLET | ORAL | Status: DC | PRN
  Administered 2019-06-28: 650 mg via ORAL
  Filled 2019-06-28: qty 2

## 2019-06-28 MED ORDER — PRENATAL MULTIVITAMIN CH
1.0000 | ORAL_TABLET | Freq: Every day | ORAL | Status: DC
  Administered 2019-06-28 – 2019-06-29 (×2): 1 via ORAL
  Filled 2019-06-28 (×2): qty 1

## 2019-06-28 MED ORDER — SIMETHICONE 80 MG PO CHEW: 80.0000 mg | CHEWABLE_TABLET | ORAL | Status: DC | PRN

## 2019-06-28 MED ORDER — WITCH HAZEL-GLYCERIN EX PADS: 1.0000 "application " | MEDICATED_PAD | CUTANEOUS | Status: DC | PRN

## 2019-06-28 MED ORDER — COCONUT OIL OIL: 1.0000 "application " | TOPICAL_OIL | Status: DC | PRN

## 2019-06-28 MED ORDER — SENNOSIDES-DOCUSATE SODIUM 8.6-50 MG PO TABS
2.0000 | ORAL_TABLET | ORAL | Status: DC
  Administered 2019-06-28 – 2019-06-29 (×2): 2 via ORAL
  Filled 2019-06-28 (×2): qty 2

## 2019-06-28 MED ORDER — IBUPROFEN 600 MG PO TABS
600.0000 mg | ORAL_TABLET | Freq: Four times a day (QID) | ORAL | Status: DC
  Administered 2019-06-28 – 2019-06-30 (×9): 600 mg via ORAL
  Filled 2019-06-28 (×9): qty 1

## 2019-06-28 MED ORDER — ONDANSETRON HCL 4 MG PO TABS: 4.0000 mg | ORAL_TABLET | ORAL | Status: DC | PRN

## 2019-06-28 MED ORDER — ZOLPIDEM TARTRATE 5 MG PO TABS: 5.0000 mg | ORAL_TABLET | Freq: Every evening | ORAL | Status: DC | PRN

## 2019-06-28 MED ORDER — DIPHENHYDRAMINE HCL 25 MG PO CAPS: 25.0000 mg | ORAL_CAPSULE | Freq: Four times a day (QID) | ORAL | Status: DC | PRN

## 2019-06-28 MED ORDER — BENZOCAINE-MENTHOL 20-0.5 % EX AERO
1.0000 "application " | INHALATION_SPRAY | CUTANEOUS | Status: DC | PRN
  Administered 2019-06-29: 1 via TOPICAL
  Filled 2019-06-28: qty 56

## 2019-06-28 MED ORDER — DIBUCAINE (PERIANAL) 1 % EX OINT: 1.0000 "application " | TOPICAL_OINTMENT | CUTANEOUS | Status: DC | PRN

## 2019-06-28 MED ORDER — TETANUS-DIPHTH-ACELL PERTUSSIS 5-2.5-18.5 LF-MCG/0.5 IM SUSP: 0.5000 mL | Freq: Once | INTRAMUSCULAR | Status: DC

## 2019-06-28 MED ORDER — ONDANSETRON HCL 4 MG/2ML IJ SOLN: 4.0000 mg | INTRAMUSCULAR | Status: DC | PRN

## 2019-06-28 NOTE — Progress Notes (Signed)
Labor Progress Note Zoe Smith is a 34 y.o. G3P2002 at [redacted]w[redacted]d with GDMA1 presented for SOL  S: Reports feeling contractions and having urge to push.  O:  BP 107/71   Pulse 89   Temp 98.5 F (36.9 C) (Oral)   Resp 15   Ht 5\' 2"  (1.575 m)   Wt 66.2 kg   LMP 09/24/2018 (Exact Date)   SpO2 100%   BMI 26.69 kg/m  EFM: 135 bpm/moderate variability/+ accles, no decels  CVE: Dilation: 10 Dilation Complete Date: 06/28/19 Dilation Complete Time: 0021 Effacement (%): 100 Cervical Position: Middle Station: Plus 1, Plus 2 Presentation: Vertex Exam by:: [email or web address] RN   A&P: 34 y.o. 34 [redacted]w[redacted]d with GDMA1, here for SOL.. #Labor: Progressing well. Tug of war performed with adequate pushes.  Will continue to have patient push with nurse. Plan to monitor closely. #Pain: Epidural #FWB: Category I #GBS negative #GDMA1: CBGs q4h  [redacted]w[redacted]d Leverett Camplin, DO 1:40 AM

## 2019-06-28 NOTE — Anesthesia Postprocedure Evaluation (Signed)
Anesthesia Post Note  Patient: Zoe Smith  Procedure(s) Performed: AN AD HOC LABOR EPIDURAL     Patient location during evaluation: Mother Baby Anesthesia Type: Epidural Level of consciousness: awake and alert and oriented Pain management: satisfactory to patient Vital Signs Assessment: post-procedure vital signs reviewed and stable Respiratory status: respiratory function stable Cardiovascular status: stable Postop Assessment: no headache, no backache, epidural receding, patient able to bend at knees, no signs of nausea or vomiting, adequate PO intake and no apparent nausea or vomiting Anesthetic complications: no    Last Vitals:  Vitals:   06/28/19 0435 06/28/19 0548  BP: 104/66 117/63  Pulse: 79 74  Resp: 18 18  Temp:  36.8 C  SpO2: 99% 100%    Last Pain:  Vitals:   06/28/19 0548  TempSrc: Oral  PainSc: 0-No pain   Pain Goal:                   [name]

## 2019-06-28 NOTE — Lactation Note (Signed)
This note was copied from a baby's chart. Lactation Consultation Note  Patient Name: Zoe Smith [id number] Date: 06/28/2019 Reason for consult: Initial assessment   P3, Baby 9 hours old.  Baby has latched x 2 since birth for 45 min & 30 min. She breastfed her last 2 daughters for approx 3 mos and hopes to bf longer with this baby. Mother's nipples appear flat but evert with stimulation. Reviewed hand expression and how to prepump to evert w/ manual pump.  Baby sleepy but mother willing to attempt latching.   Encouraged mother to support breast but put hand back further to allow baby to latch /w more areola. Feed on demand with cues.  Goal 8-12+ times per day after first 24 hrs.  Place baby STS if not cueing.  Mom made aware of O/P services, breastfeeding support groups, community resources, and our phone # for post-discharge questions.      Maternal Data Has patient been taught Hand Expression?: Yes Does the patient have breastfeeding experience prior to this delivery?: Yes  Feeding Feeding Type: Breast Fed  LATCH Score                   Interventions Interventions: Breast feeding basics reviewed;Assisted with latch;Skin to skin;Hand express;Pre-pump if needed;Hand pump  Lactation Tools Discussed/Used     Consult Status Consult Status: Follow-up Date: 06/29/19 Follow-up type: In-patient    [name] [facility] 06/28/2019, 12:12 PM

## 2019-06-28 NOTE — Discharge Summary (Addendum)
Postpartum Discharge Summary     Patient Name: Zoe Smith DOB: [birth year] MRN: [id number]  Date of admission: 06/27/2019 Delivery date:06/28/2019  Delivering provider: Christin Fudge  Date of discharge: 06/30/2019  Admitting diagnosis: Labor and delivery, indication for care [O75.9] Gestational diabetes [O24.419] Intrauterine pregnancy: [redacted]w[redacted]d    Secondary diagnosis:  Active Problems:   Supervision of other normal pregnancy, antepartum   Abnormal glucose tolerance test (GTT) during pregnancy, antepartum   Labor and delivery, indication for care   Gestational diabetes  Additional problems: Anemia of pregnancy    Discharge diagnosis: Term Pregnancy Delivered, GDM A1 and Anemia                                              Post partum procedures:none Augmentation: none Complications: None  Hospital course: Onset of Labor With Vaginal Delivery      34y.o. yo [id number] admitted in Active Labor on 06/27/2019. Patient had an uncomplicated labor course as follows:  Membrane Rupture Time/Date: 11:25 PM ,06/27/2019   Delivery Method:Vaginal, Spontaneous  Episiotomy: None  Lacerations:  None  Patient had an uncomplicated postpartum course.  She is ambulating, tolerating a regular diet, passing flatus, and urinating well. Patient is discharged home in stable condition on 06/30/19.  Newborn Data: Birth date:06/28/2019  Birth time:2:41 AM  Gender:Female  Living status:Living  Apgars:8 ,9  Weight:3195 g   Magnesium Sulfate received: No BMZ received: No Rhophylac:No MMR:No T-DaP:Given prenatally Flu: N/A Transfusion:No  Physical exam  Vitals:   06/29/19 0525 06/29/19 1416 06/29/19 2001 06/30/19 0544  BP: 108/73 (!) 91/58 103/71 106/78  Pulse: 63 72 70 62  Resp: 18 16 18 16   Temp: 98.1 F (36.7 C) 98.5 F (36.9 C) 98.4 F (36.9 C) 97.7 F (36.5 C)  TempSrc: Oral Oral Oral Axillary  SpO2: 100% 100%    Weight:      Height:       General:  alert, cooperative and no distress Lochia: appropriate Uterine Fundus: firm Incision: N/A DVT Evaluation: No evidence of DVT seen on physical exam. Labs: Lab Results  Component Value Date   WBC 10.5 06/28/2019   HGB 10.9 (L) 06/28/2019   HCT 34.5 (L) 06/28/2019   MCV 87.6 06/28/2019   PLT 208 06/28/2019    No flowsheet data found. Edinburgh Score: Edinburgh Postnatal Depression Scale Screening Tool 06/29/2019  I have been able to laugh and see the funny side of things. 0  I have looked forward with enjoyment to things. 0  I have blamed myself unnecessarily when things went wrong. 0  I have been anxious or worried for no good reason. 0  I have felt scared or panicky for no good reason. 0  Things have been getting on top of me. 0  I have been so unhappy that I have had difficulty sleeping. 0  I have felt sad or miserable. 0  I have been so unhappy that I have been crying. 0  The thought of harming myself has occurred to me. 0  Edinburgh Postnatal Depression Scale Total 0     After visit meds:  Allergies as of 06/30/2019   No Known Allergies     Medication List    TAKE these medications   Accu-Chek Guide test strip Generic drug: glucose blood Use to check blood sugars four times a  day was instructed   Accu-Chek Guide w/Device Kit 1 Device by Does not apply route 4 (four) times daily.   Accu-Chek Softclix Lancets lancets 1 each by Other route 4 (four) times daily.   acetaminophen 325 MG tablet Commonly known as: TYLENOL Take 650 mg by mouth every 6 (six) hours as needed for moderate pain or headache.   Blood Pressure Cuff Misc 1 Device by Does not apply route once a week.   Blood Pressure Kit Devi 1 kit by Does not apply route as needed.   calcium carbonate 500 MG chewable tablet Commonly known as: TUMS - dosed in mg elemental calcium Chew 1 tablet by mouth daily as needed for indigestion or heartburn.   ferrous sulfate 325 (65 FE) MG tablet Take 1 tablet  (325 mg total) by mouth 2 (two) times daily with a meal.   ibuprofen 600 MG tablet Commonly known as: ADVIL Take 1 tablet (600 mg total) by mouth every 6 (six) hours.   omega-3 acid ethyl esters 1 g capsule Commonly known as: LOVAZA Take by mouth 2 (two) times daily.   prenatal multivitamin Tabs tablet Take 1 tablet by mouth daily at 12 noon.        Discharge home in stable condition Infant Feeding: Breast Infant Disposition:home with mother Discharge instruction: per After Visit Summary and Postpartum booklet. Activity: Advance as tolerated. Pelvic rest for 6 weeks.  Diet: routine diet Future Appointments: Future Appointments  Date Time Provider [place]  08/09/2019  9:00 AM [facility] None  08/09/2019  9:15 AM Gavin Pound, CNM [facility] None   Follow up Visit: Follow-up Information    [facility]. Go in 4 week(s).   Why: for postpartum visit Contact information: [address] [place] [address] [phone]           Please schedule this patient for a Virtual postpartum visit in 4 weeks with the following provider: Any provider. Additional Postpartum F/U:2 hour GTT  High risk pregnancy complicated by: GDM Delivery mode:  Vaginal, Spontaneous  Anticipated Birth Control:  Condoms   06/30/2019 Cleophas Dunker, DO  GME ATTESTATION:  I saw and evaluated the patient. I agree with the findings and the plan of care as documented in the resident's note.  Merilyn Baba, DO OB Fellow, [place] [facility] [place] 06/30/2019 7:10 AM

## 2019-06-28 NOTE — Progress Notes (Signed)
Labor Progress Note KATRENA STEHLIN is a 34 y.o. G3P2002 at [redacted]w[redacted]d with GDMA1 presented for SOL  S: Continues to push using tug of war.    O:  BP 107/71   Pulse 89   Temp 98.5 F (36.9 C) (Oral)   Resp 15   Ht 5\' 2"  (1.575 m)   Wt 66.2 kg   LMP 09/24/2018 (Exact Date)   SpO2 100%   BMI 26.69 kg/m  EFM: 135 bpm/moderate variability/+ accles, no decels  CVE: Complete, While pushing, down to +4 station     A&P: 34 y.o. 34 [redacted]w[redacted]d with GDMA1, here for SOL.. #Labor: Pushing for 25 minutes.  Now +4 station, doing well.  Continue current management. #Pain: Epidural #FWB: Category I #GBS negative #GDMA1: CBGs q4h  [redacted]w[redacted]d Meccariello, DO 2:05 AM

## 2019-06-28 NOTE — Progress Notes (Addendum)
Post Partum Day #0 s/p SVD after presenting for SOL, hx GDMA1  Subjective:  Zoe Smith is a 34 y.o. [id number] [redacted]w[redacted]d s/p SVD after presented for SOL, hx GDMA1.  No acute events overnight.  Pt denies problems with ambulating, voiding or po intake.  She denies nausea or vomiting.  Pain is well controlled.  She has not had flatus. She has not had bowel movement.  Lochia Large.  Plan for birth control is condoms.  Method of Feeding: Breast  Objective: BP 117/63 (BP Location: Right Arm)   Pulse 74   Temp 98.3 F (36.8 C) (Oral)   Resp 18   Ht 5\' 2"  (1.575 m)   Wt 66.2 kg   LMP 09/24/2018 (Exact Date)   SpO2 100%   Breastfeeding Unknown   BMI 26.69 kg/m   Physical Exam:  General: alert, cooperative and no distress Lochia:normal flow Chest: breathing comfortably on RA Abdomen: soft, nontender, fundus firm at/below umbilicus Uterine Fundus: firm DVT Evaluation: No evidence of DVT seen on physical exam. Extremities: No edema  Recent Labs    06/27/19 2139 06/28/19 0455  HGB 11.9* 10.9*  HCT 37.4 34.5*    Assessment/Plan:  ASSESSMENT: Zoe Smith is a 34 y.o. G3P3003 [redacted]w[redacted]d ppd #0 s/p NSVD doing well.   - Plan for discharge home tomorrow - Breastfeeding - Contraception: Condoms - Encourage ambulation throughout the day - GDMA1: CBGs TID AC/HS, Outpatient 2 hr GTT - Hgb: 11.9>10.9, no intervention needed at this time    LOS: 1 day   [redacted]w[redacted]d 06/28/2019, 6:27 AM

## 2019-06-28 NOTE — Progress Notes (Signed)
Labor Progress Note Zoe Smith is a 34 y.o. G3P2002 at [redacted]w[redacted]d with GDMA1 presented for SOL  S: Continues to endorse strong pressure.  O:  BP 118/82   Pulse 93   Temp 98.5 F (36.9 C) (Oral)   Resp 15   Ht 5\' 2"  (1.575 m)   Wt 66.2 kg   LMP 09/24/2018 (Exact Date)   SpO2 100%   BMI 26.69 kg/m  EFM: 145 bpm/moderate variability/+accels, 2 variable decels  CVE: Dilation: 10 Dilation Complete Date: 06/28/19 Dilation Complete Time: 0021 Effacement (%): 100 Cervical Position: Middle Station: 0, Plus 1 Presentation: Vertex Exam by:: [email or web address] RN    A&P: 34 y.o. 34 [redacted]w[redacted]d with GDMA1, here for SOL.. #Labor: Progressing well. No complete. Continue expectant management.  Plan to recheck in 1 hr. #Pain: Epidural #FWB: Category II #GBS negative #GDMA1: CBGs q4h  [redacted]w[redacted]d Zoe Reever, DO 12:29 AM

## 2019-06-29 ENCOUNTER — Encounter: Payer: Medicaid Other | Admitting: Obstetrics and Gynecology

## 2019-06-29 ENCOUNTER — Other Ambulatory Visit (HOSPITAL_COMMUNITY): Payer: Medicaid Other

## 2019-06-29 LAB — GLUCOSE, CAPILLARY: Glucose-Capillary: 68 mg/dL — ABNORMAL LOW (ref 70–99)

## 2019-06-29 NOTE — Discharge Instructions (Signed)

## 2019-06-29 NOTE — Progress Notes (Signed)
Post Partum Day #1 s/p SVD after presenting for SOL, hx GDMA1  Subjective:  Zoe Smith is a 34 y.o. [id number] [redacted]w[redacted]d s/p SVD after presented for SOL, hx GDMA1.  No acute events overnight.  Pt denies problems with ambulating, voiding or po intake.  She denies nausea or vomiting.  Pain is well controlled.  She has had flatus. She has not had bowel movement.  Lochia Moderate.  Plan for birth control is condoms.  Method of Feeding: Breast  Objective: BP 105/75 (BP Location: Left Arm)   Pulse 80   Temp 97.8 F (36.6 C) (Oral)   Resp 18   Ht 5\' 2"  (1.575 m)   Wt 66.2 kg   LMP 09/24/2018 (Exact Date)   SpO2 98%   Breastfeeding Unknown   BMI 26.69 kg/m   Physical Exam:  General: alert, cooperative and no distress Lochia:normal flow Chest: breathing comfortably on RA Abdomen: soft, nontender, fundus firm at/below umbilicus Uterine Fundus: firm DVT Evaluation: No evidence of DVT seen on physical exam. Extremities: No edema  Recent Labs    06/27/19 2139 06/28/19 0455  HGB 11.9* 10.9*  HCT 37.4 34.5*    Assessment/Plan:  ASSESSMENT: Zoe Smith is a 34 y.o. G3P3003 [redacted]w[redacted]d ppd #1 s/p NSVD doing well.   - Plan for discharge home tomorrow as patient would like to stay for another day. - Breastfeeding - Contraception: Condoms - GDMA1: CBGs TID AC/HS, Outpatient 2 hr GTT - Hgb: 11.9>10.9, no intervention needed at this time    LOS: 2 days   [redacted]w[redacted]d 06/29/2019, 5:20 AM

## 2019-06-29 NOTE — Lactation Note (Signed)
This note was copied from a baby's chart. Lactation Consultation Note  Patient Name: Zoe Smith [id number] Date: 06/29/2019 Reason for consult: Follow-up assessment   P3, Baby 30 hours old.  2 voids/3 stools in the last 24 hours and stools are starting to transition. 7% weight loss. Per mother, baby is doing well with latching on both breasts. Baby recently fed for 20 min.  Encouraged mother to post pump with manual pump or hand express and give volume back to baby with spoon with each feeding to help stabilize weight loss. Parents state they are going to purchase a DEBP. Discussed milk storage, engorgement care and monitoring voids/stools.    Maternal Data    Feeding Feeding Type: Breast Fed  LATCH Score                   Interventions Interventions: Breast feeding basics reviewed;Hand pump  Lactation Tools Discussed/Used     Consult Status Consult Status: Complete Date: 06/29/19    [name] [facility] 06/29/2019, 9:28 AM

## 2019-06-30 LAB — GLUCOSE, CAPILLARY: Glucose-Capillary: 69 mg/dL — ABNORMAL LOW (ref 70–99)

## 2019-06-30 MED ORDER — IBUPROFEN 600 MG PO TABS: 600.0000 mg | ORAL_TABLET | Freq: Four times a day (QID) | ORAL | 0 refills | Status: AC

## 2019-06-30 NOTE — Lactation Note (Signed)
This note was copied from a baby's chart. Lactation Consultation Note  Patient Name: Zoe Smith [id number] Date: 06/30/2019 Reason for consult: Follow-up assessment;Term;Infant weight loss;Other (Comment)(weight loss increased from 7 % to 10 % this am - per mom milk coming in)  Baby is 78 hours old , and D/C has been written .  Per mom plans is to pre- pump with HAKKA and purchase a DEBP.  LC mentioned to mom she also can consider renting , and check with getting signed up  With [facility] for a loaner. Per mom looking at purchasing.  Per mom the right nipple alittle sore. Mom showed the LC her tissue and LC noted areola  Edema and provided a set of shells with instructions between feedings until the depth with latch  Is consistent.  Per mom breast are fuller , and hearing more swallows and baby is feeding longer.  LC reassured mom that is a good sign.  LC recommended due to weight loss of 10 % - prior to every feeding until weight comes up,  Breast massage, hand express, pre-pump with her HAKKA to prime the milk ducts and make sure the areola is compressible for a deep latch.  LC stressed the importance of STS feedings until the baby can stay awake for majority of feeding, back to birth weight, and gaining steadily.  Discussed nutritive vs non - nutritive feeding patterns and the importance of watching for hanging out latched.  LC recommended extra post pumping , and supplementing back with formula ( per Pedis ) and when EBM available use it for supplementing.  Mom aware of the [facility] resources and has the pamphlet with phone numbers.     Maternal Data    Feeding Feeding Type: (per mom  last fed at 7 am)  LATCH Score                   Interventions Interventions: Breast feeding basics reviewed;Shells;Hand pump;DEBP  Lactation Tools Discussed/Used Tools: Shells;Pump;Flanges Flange Size: 24;27 Shell Type: Inverted Breast pump type: Double-Electric Breast Pump;Manual Pump  Review: Milk Storage Initiated by:: MAI Date initiated:: 06/30/19   Consult Status Consult Status: Complete(per mom has a Pedis F/U tomorrow for weight check) Date: 06/30/19    Matilde Sprang K[place] Sl[place]ki 06/30/2019, 9:24 AM

## 2019-07-01 ENCOUNTER — Inpatient Hospital Stay (HOSPITAL_COMMUNITY)
Admission: AD | Admit: 2019-07-01 | Payer: Medicaid Other | Source: Home / Self Care | Admitting: Obstetrics and Gynecology

## 2019-07-01 ENCOUNTER — Encounter (HOSPITAL_COMMUNITY): Payer: Medicaid Other

## 2019-07-01 ENCOUNTER — Inpatient Hospital Stay (HOSPITAL_COMMUNITY): Payer: Medicaid Other

## 2019-08-09 ENCOUNTER — Other Ambulatory Visit: Payer: Commercial Managed Care - PPO

## 2019-08-09 ENCOUNTER — Ambulatory Visit (INDEPENDENT_AMBULATORY_CARE_PROVIDER_SITE_OTHER): Payer: Commercial Managed Care - PPO

## 2019-08-09 ENCOUNTER — Other Ambulatory Visit: Payer: Self-pay

## 2019-08-09 NOTE — Progress Notes (Signed)
..     Post Partum Visit Note  Zoe Smith is a 34 y.o. G32P3003 female who presents for a postpartum visit. She is 6 weeks postpartum following a normal spontaneous vaginal delivery.  I have fully reviewed the prenatal and intrapartum course. The delivery was at 39.4 gestational weeks.  Anesthesia: epidural. Postpartum course has been good. Baby is doing well good. Baby is feeding by breast. Bleeding no bleeding. Bowel function is normal. Bladder function is normal. Patient is not sexually active. Contraception method is condoms. Postpartum depression screening: negative.  The following portions of the patient's history were reviewed and updated as appropriate: allergies, current medications, past family history, past medical history, past social history, past surgical history and problem list.  Review of Systems Pertinent items are noted in HPI.    Objective:  Last menstrual period 09/24/2018, unknown if currently breastfeeding.  General:  alert, cooperative and no distress   Breasts:  Not inspected  Lungs: clear to auscultation bilaterally  Heart:  regular rate and rhythm  Abdomen: soft, non-tender; bowel sounds normal; no masses,  no organomegaly   Vulva:  not evaluated  Vagina: not evaluated  Cervix:  Not Evaluated  Corpus: not examined  Adnexa:  not evaluated  Rectal Exam: Not performed.        Assessment:   6 week postpartum exam H/O GDM Pap UTD-12/2018 Breastfeeding Condom Usage  Plan:   Essential components of care per ACOG recommendations:  1.  Mood and well being: Patient with negative depression screening today. Reviewed local resources for support. Patient is a stay at home mom and reports she receives good support from her husband.  - Patient does not use tobacco.  - hx of drug use? No    2. Infant care and feeding:  -Patient currently breastmilk feeding? Yes  express breastmilk and refrigerated area to store breastmilk. Reviewed importance of draining  breast regularly to support lactation. -Social determinants of health (SDOH) reviewed in EPIC. No concerns  3. Sexuality, contraception and birth spacing - Patient does not want a pregnancy in the next year.  Desired family size is UNDECIDED AMT OF children. Patient reports that her husband does not desire more children, but she is unsure. - Reviewed forms of contraception in tiered fashion. Patient desired condoms today.   - Discussed birth spacing of 18 months  4. Sleep and fatigue -Encouraged family/partner/community support of 4 hrs of uninterrupted sleep to help with mood and fatigue  5. Physical Recovery  - Discussed patients delivery and complications.  Patient reports that things went "really good" when speaking about her birthing experience.  - Patient had a 1st degree laceration with no repair. Patient reports comfort with returning to sexual activity.   - Patient has urinary incontinence? No - Patient is safe to resume physical and sexual activity  6.  Health Maintenance - Last pap smear done 12/2018 and was normal with negative HPV.  7. No Chronic Disease -2 hr GTT not performed today due to patient eating. -Patient to return tomorrow for testing.  - PCP follow up  Cherre Robins, CNM [facility] [employer], [facility]

## 2019-08-09 NOTE — Patient Instructions (Signed)
Glucose Tolerance Test °Why am I having this test? °The glucose tolerance test (GTT) is done to check how your body processes sugar (glucose). This is one of several tests used to diagnose diabetes (diabetes mellitus). °Your health care provider may recommend this test if you: °· Have a family history of diabetes. °· Are very overweight (obese). °· Have infections that keep coming back (recurring). °· Have had a lot of wounds that did not heal quickly, especially on your legs and feet. °· Are a woman and have a history of giving birth to very large babies or a history of repeated fetal loss (stillbirth). °· Have had high glucose levels in your urine or blood: °? During a past pregnancy. °? After a heart attack, surgery, or prolonged periods of high stress. °What is being tested? °This test measures the amount of glucose in your blood at different times during a period of 2 hours. This indicates how well your body is able to process glucose. °What kind of sample is taken? ° °Blood samples are required for this test. They are usually collected by inserting a needle into a blood vessel. °How do I prepare for this test? °· For 3 days before your test, eat normally. Have plenty of carbohydrate-rich foods. °· Follow instructions from your health care provider about: °? Eating or drinking restrictions on the day of the test. You may be asked to not eat or drink anything other than water (fast) starting 8-12 hours before the test. °? Changing or stopping your regular medicines. Some medicines may interfere with this test. °Tell a health care provider about: °· All medicines you are taking, including vitamins, herbs, eye drops, creams, and over-the-counter medicines. °· Any blood disorders you have. °· Any surgeries you have had. °· Any medical conditions you have. °· Whether you are pregnant or may be pregnant. °What happens during the test? °First, your blood glucose will be measured. This is referred to as your fasting  blood glucose, since you fasted before the test. Then, you will drink a glucose solution that contains a certain amount of glucose. Your blood glucose will be measured again 1 and 2 hours after drinking the solution. °This test takes 2 hours to complete. You will need to stay at the testing location during this time. During the testing period: °· Do not eat or drink anything other than the glucose solution. You will be allowed to drink water. °· Do not exercise. °· Do not use any products that contain nicotine or tobacco, such as cigarettes and e-cigarettes. If you need help stopping, ask your health care provider. °The testing procedure may vary among health care providers and hospitals. °How are the results reported? °Your results will be reported as milligrams of glucose per deciliter of blood (mg/dL) or millimoles per liter (mmol/L). Your health care provider will compare your results to normal ranges that were established after testing a large group of people (reference ranges). Reference ranges may vary among labs and hospitals. For this test, common reference ranges are: °· Fasting: less than 110 mg/dL (6.1 mmol/L). °· 1 hour after drinking glucose: less than 180 mg/dL (10.0 mmol/L). °· 2 hours after drinking glucose: less than 140 mg/dL (7.8 mmol/L). °What do the results mean? °Results that are within the reference ranges are considered normal, meaning that your glucose levels are well controlled. Results higher than the reference ranges may mean that you recently experienced stress, such as from an injury or a sudden (acute) condition like a   heart attack or stroke, or that you have: °· Diabetes. °· Cushing syndrome. °· Tumors such as pheochromocytoma or glucagonoma. °· Kidney failure. °· Pancreatitis. °· Hyperthyroidism. °· An infection. °Talk with your health care provider about what your results mean. °Questions to ask your health care provider °Ask your health care provider, or the department that is  doing the test: °· When will my results be ready? °· How will I get my results? °· What are my treatment options? °· What other tests do I need? °· What are my next steps? °Summary °· The glucose tolerance test (GTT) is done to check how your body processes sugar (glucose). This is one of several tests used to diagnose diabetes (diabetes mellitus). °· This test measures the amount of glucose in your blood at different times during a period of 2 hours. This indicates how well your body is able to process glucose. °· Talk with your health care provider about what your results mean. °This information is not intended to replace advice given to you by your health care provider. Make sure you discuss any questions you have with your health care provider. °Document Revised: 01/09/2017 Document Reviewed: 09/08/2016 °Elsevier Patient Education © 2020 Elsevier Inc. ° °

## 2019-08-10 ENCOUNTER — Other Ambulatory Visit: Payer: Commercial Managed Care - PPO

## 2019-08-11 LAB — GLUCOSE TOLERANCE, 2 HOURS
Glucose, 2 hour: 100 mg/dL (ref 65–139)
Glucose, GTT - Fasting: 73 mg/dL (ref 65–99)

## 2020-03-26 ENCOUNTER — Other Ambulatory Visit: Payer: Self-pay | Admitting: *Deleted

## 2020-03-26 NOTE — Patient Outreach (Signed)
Care Coordination  03/26/2020  Zoe Smith 03-10-1985 [id number]     Medicaid Managed Care   Unsuccessful Outreach Note  03/26/2020 Name: Zoe Smith MRN: [id number] DOB: October 21, 1985  Referred by: Patient, No Pcp Per Reason for referral : High Risk Managed Medicaid (Unsuccessful RNCM initial outreach)   An unsuccessful telephone outreach was attempted today. The patient was referred to the case management team for assistance with care management and care coordination.   Follow Up Plan: A HIPAA compliant phone message was left for the patient providing contact information and requesting a return call.  The care management team will reach out to the patient again over the next 7-14 days.   Estanislado Emms RN, BSN [place]  [employer]

## 2020-03-26 NOTE — Patient Instructions (Signed)
Visit Information  Ms. [name]  - as a part of your Medicaid benefit, you are eligible for care management and care coordination services at no cost or copay. I was unable to reach you by phone today but would be happy to help you with your health related needs. Please feel free to call me @ [phone].   A member of the Managed Medicaid care management team will reach out to you again over the next 7-14 days.   Estanislado Emms RN, BSN [place]  [employer]

## 2020-04-03 ENCOUNTER — Telehealth: Payer: Self-pay | Admitting: General Practice

## 2020-04-03 NOTE — Telephone Encounter (Signed)
I attempted to reach Zoe Smith today to schedule her for a phone visit with the Managed Medicaid team. I left my contact info on her VM. I will reach out again in the next 7-14 days if I have not heard back from her.

## 2021-07-30 IMAGING — US US MFM OB DETAIL+14 WK
1 series · 13 of 28 positions shown · non-contrast
Comparison: none

[Series 1: us mfm ob detail+14 wk · 13 of 89 slices shown]
[im 4/89]
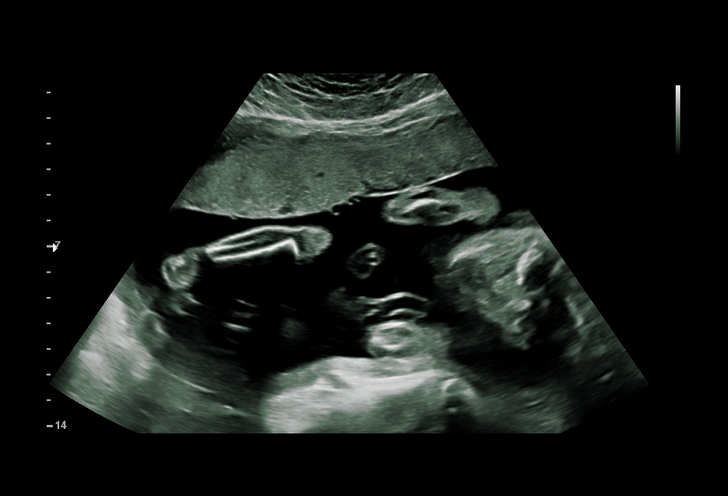
[im 10/89]
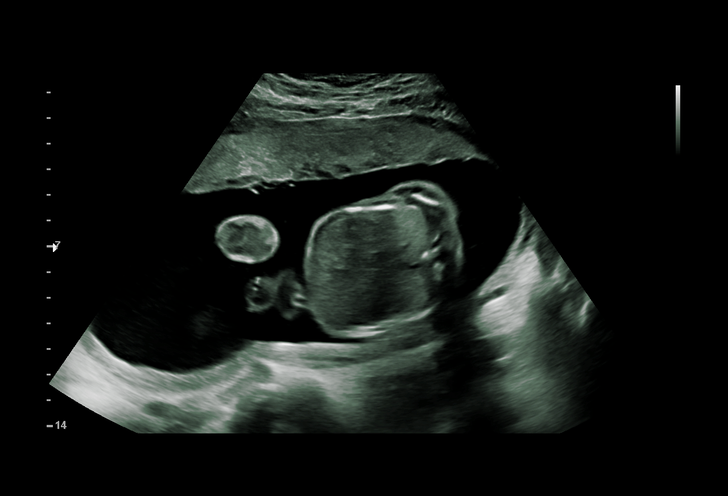
[im 17/89]
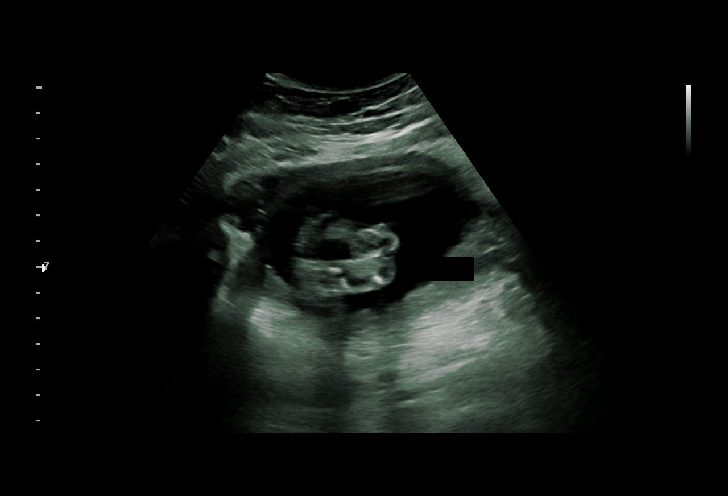
[im 23/89]
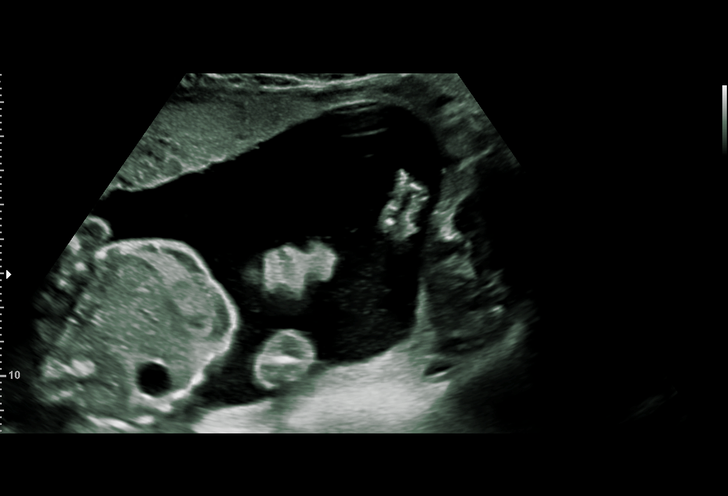
[im 30/89]
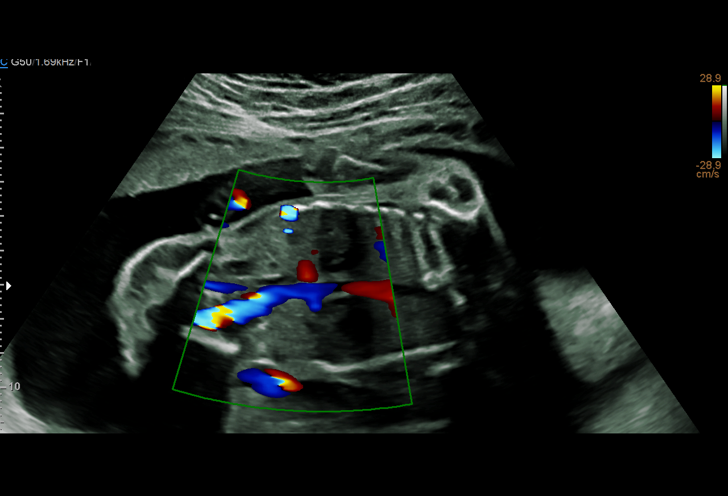
[im 36/89]
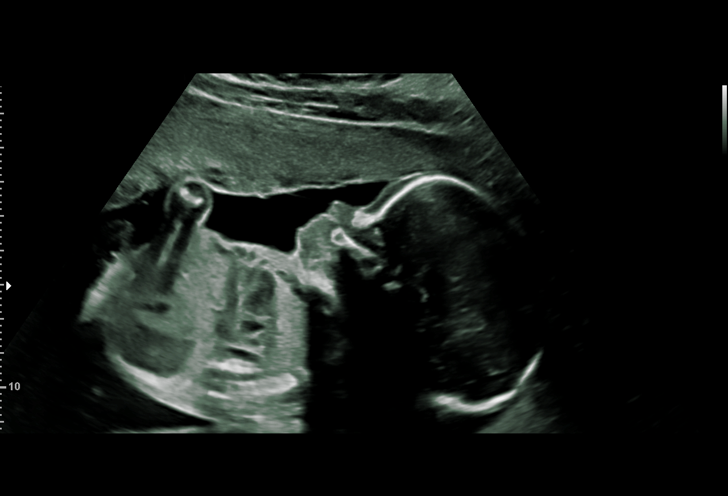
[im 46/89]
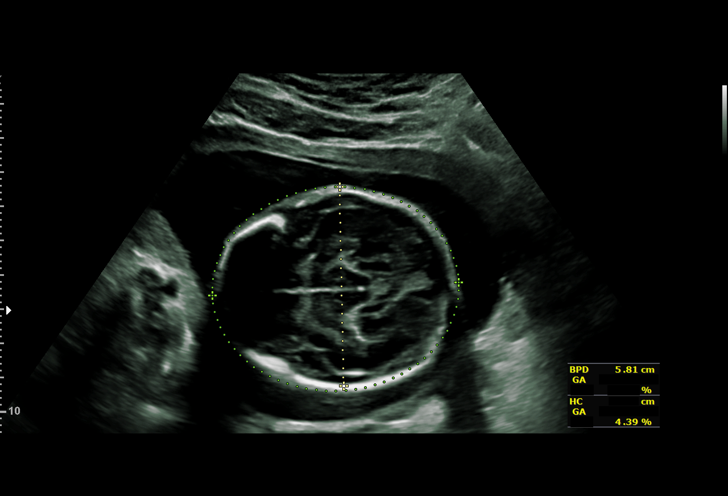
[im 53/89]
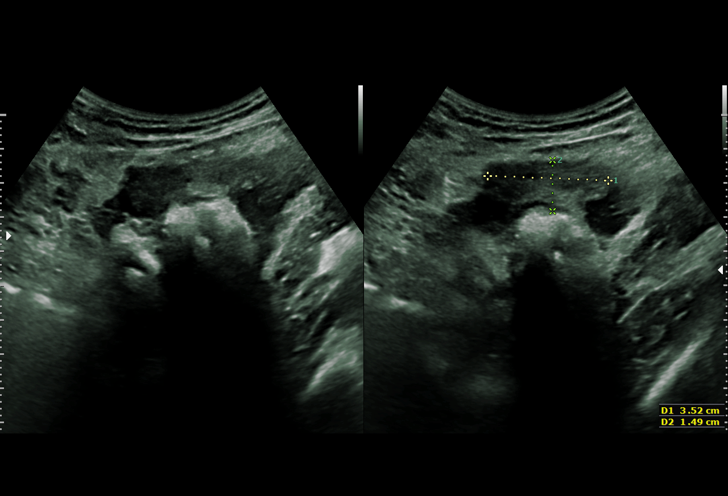
[im 59/89]
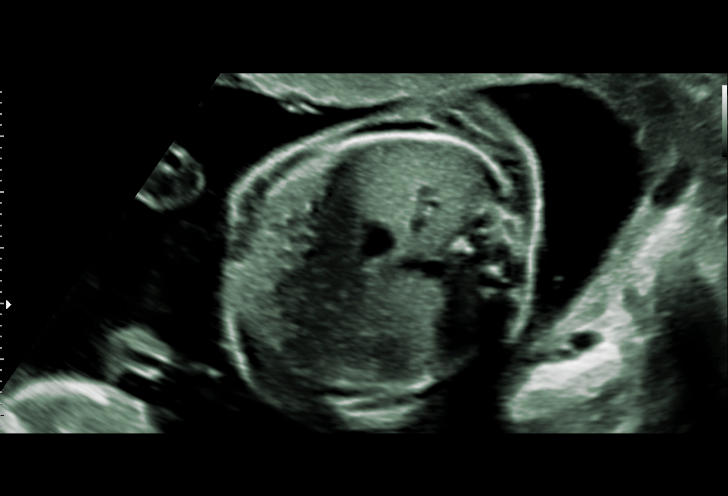
[im 66/89]
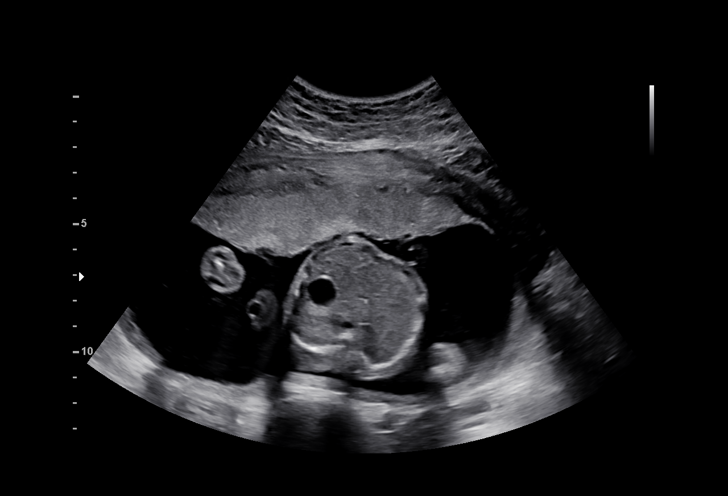
[im 72/89]
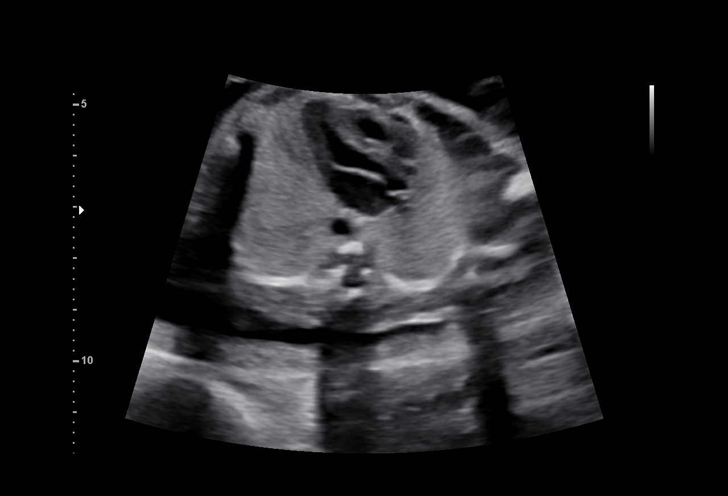
[im 79/89]
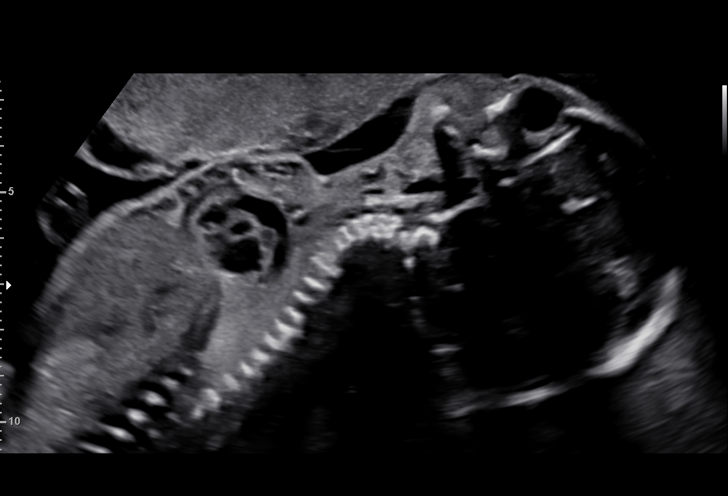
[im 85/89]
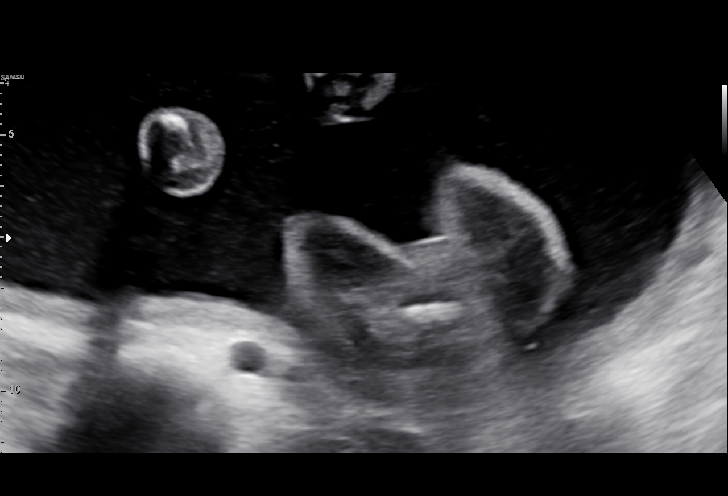

[13 of 28 positions shown; findings below may reference images not displayed]

----------------------------------------------------------------------

 ----------------------------------------------------------------------
Indications

  Fetal abnormality - other known or
  suspected (small for gestational age)
  Encounter for antenatal screening for
  malformations
  24 weeks gestation of pregnancy
  Low risk NIPS, NEG Horizon
 ----------------------------------------------------------------------
Fetal Evaluation

 Num Of Fetuses:         1
 Fetal Heart Rate(bpm):  161
 Cardiac Activity:       Observed
 Presentation:           Cephalic
 Placenta:               Anterior
 P. Cord Insertion:      Visualized, central

 Amniotic Fluid
 AFI FV:      Within normal limits

                             Largest Pocket(cm)

Biometry

 BPD:      57.7  mm     G. Age:  23w 5d         31  %    CI:        77.22   %    70 - 86
                                                         FL/HC:      19.4   %    18.7 -
 HC:      207.9  mm     G. Age:  22w 6d          5  %    HC/AC:      1.17        1.05 -
 AC:      178.1  mm     G. Age:  22w 5d          9  %    FL/BPD:     69.8   %    71 - 87
 FL:       40.3  mm     G. Age:  23w 0d         12  %    FL/AC:      22.6   %    20 - 24
 HUM:      37.4  mm     G. Age:  23w 1d         21  %
 CER:      24.6  mm     G. Age:  22w 4d         19  %
 LV:        5.5  mm
 CM:        5.4  mm

 Est. FW:     543  gm      1 lb 3 oz      7  %
OB History

 Gravidity:    3         Term:   2
 Living:       2
Gestational Age

 LMP:           24w 0d        Date:  09/24/18                 EDD:   07/01/19
 U/S Today:     23w 1d                                        EDD:   07/07/19
 Best:          24w 0d     Det. By:  LMP  (09/24/18)          EDD:   07/01/19
Anatomy

 Cranium:               Appears normal         LVOT:                   Appears normal
 Cavum:                 Appears normal         Aortic Arch:            Appears normal
 Ventricles:            Appears normal         Ductal Arch:            Not well visualized
 Choroid Plexus:        Appears normal         Diaphragm:              Appears normal
 Cerebellum:            Appears normal         Abdomen:                Appears normal
 Posterior Fossa:       Appears normal         Abdominal Wall:         Appears nml (cord
                                                                       insert, abd wall)
 Nuchal Fold:           Not applicable (>20    Cord Vessels:           Appears normal (3
                        wks GA)                                        vessel cord)
 Face:                  Appears normal         Kidneys:                Appear normal
                        (orbits and profile)
 Lips:                  Appears normal         Bladder:                Appears normal
 Palate:                Appears normal         Spine:                  Appears normal
 Thoracic:              Appears normal         Upper Extremities:      Appears normal
 Heart:                 Appears normal         Lower Extremities:      Appears normal
                        (4CH, axis, and
                        situs)
 RVOT:                  Appears normal

 Other:  Nasal bone visualized. Heels and 5th digit visualized.
Cervix Uterus Adnexa

 Cervix
 Length:           3.49  cm.
 Normal appearance by transabdominal scan.

 Uterus
 No abnormality visualized.

 Left Ovary
 Within normal limits.

 Right Ovary
 Within normal limits.

 Cul De Sac
 No free fluid seen.
Comments

 This patient was seen for a detailed fetal anatomy scan. She
 denies any significant past medical history and denies any
 problems in her current pregnancy.
 She had a cell free DNA test earlier in her pregnancy which
 indicated a low risk for trisomy 21, 18, and 13.
 The overall fetal growth measures in the lower normal range.
 The patient reports that she tends to have smaller babies.
 Her two other children were born at full-term weighing 5-1/2
 to 6 pounds.  There was normal amniotic fluid noted today.
 There were no obvious fetal anomalies noted on today's

 The patient was informed that anomalies may be missed due
 to technical limitations. If the fetus is in a suboptimal position
 or maternal habitus is increased, visualization of the fetus in
 the maternal uterus may be impaired.
 A follow-up exam was scheduled in 4 weeks to assess the
 fetal growth.

## 2021-11-05 IMAGING — US US MFM OB FOLLOW-UP
1 series · 14 of 28 positions shown · non-contrast
Comparison: none

[Series 1: us mfm ob follow-up · 28 acquisitions, 14 frames shown]
[im 2/28]
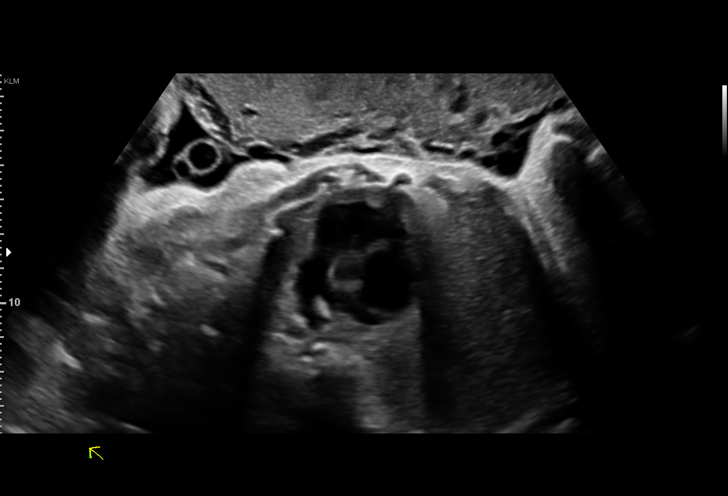
[im 4/28]
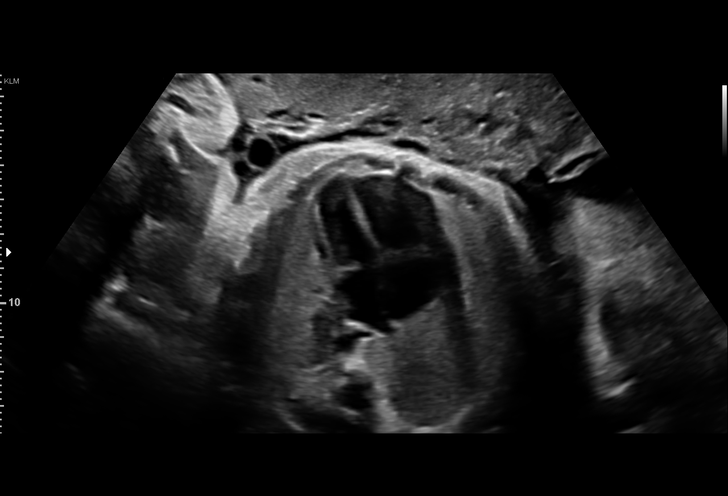
[im 6/28]
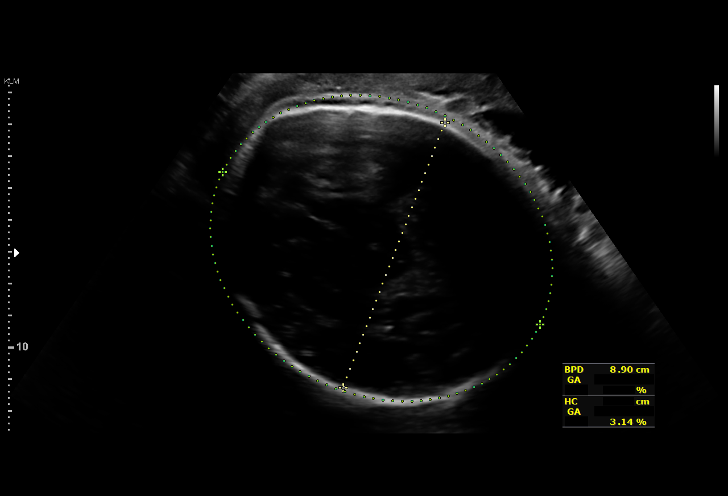
[im 8/28]
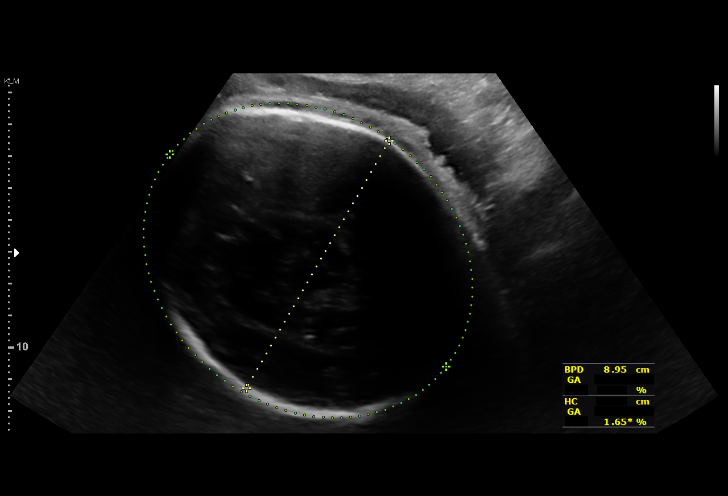
[im 10/28]
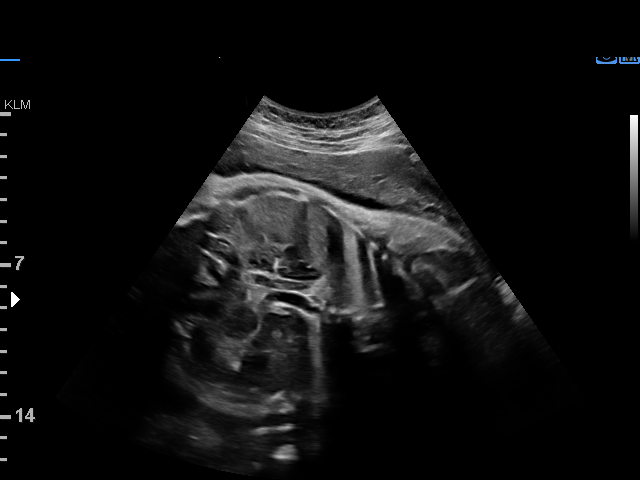
[im 12/28]
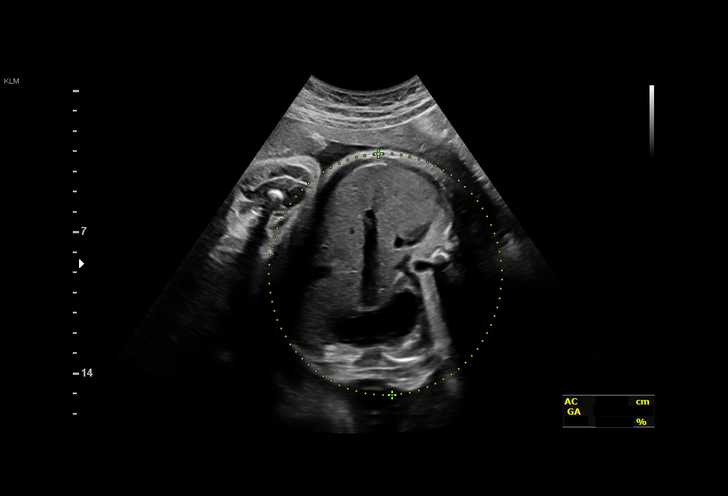
[im 14/28]
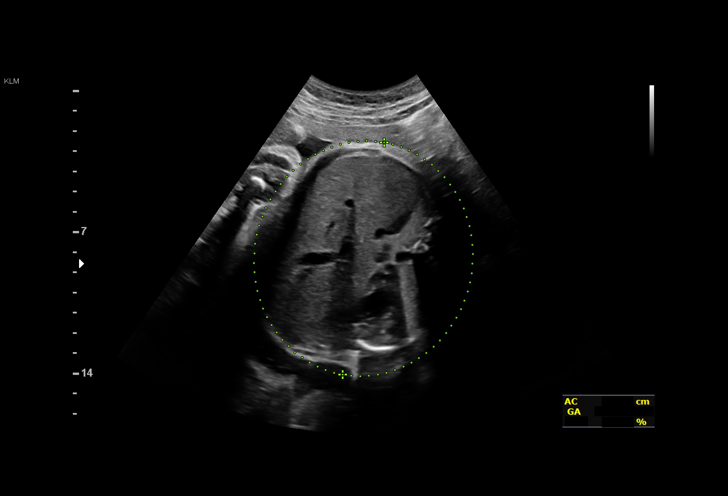
[im 16/28]
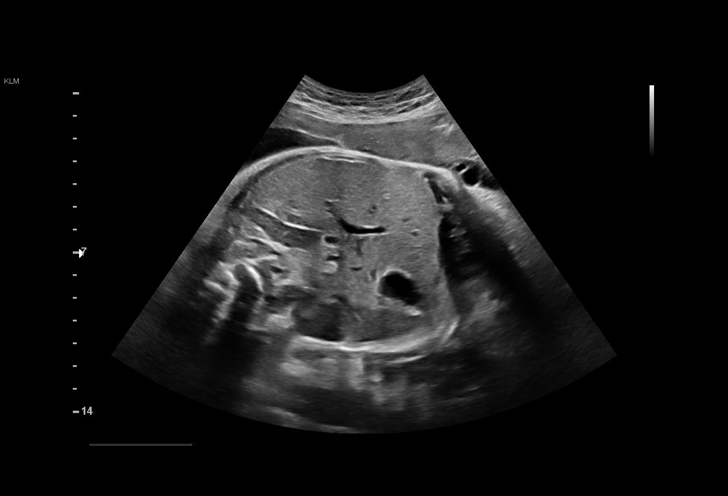
[im 18/28]
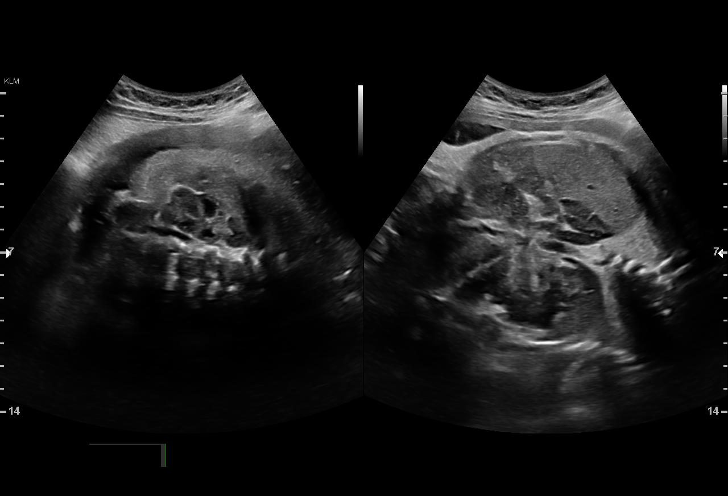
[im 20/28]
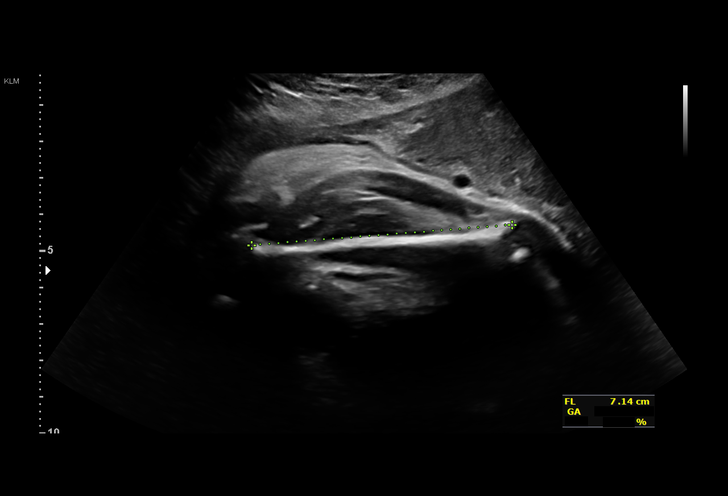
[im 22/28]
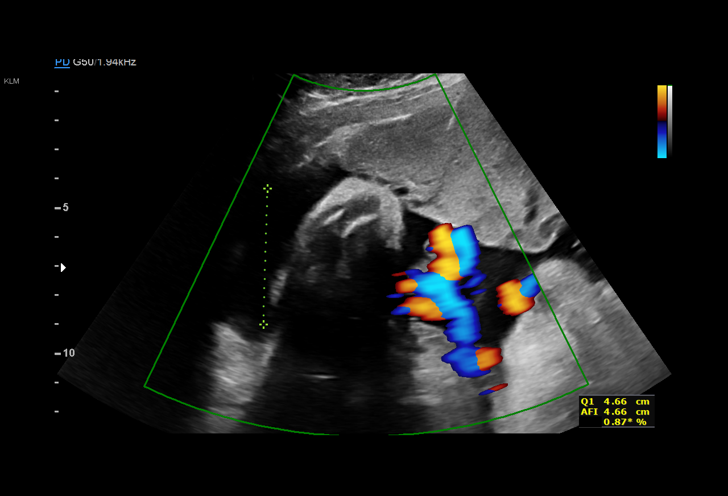
[im 24/28]
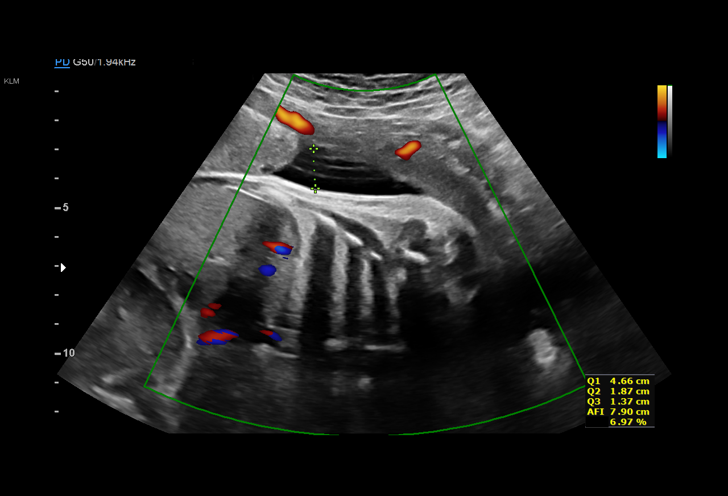
[im 26/28]
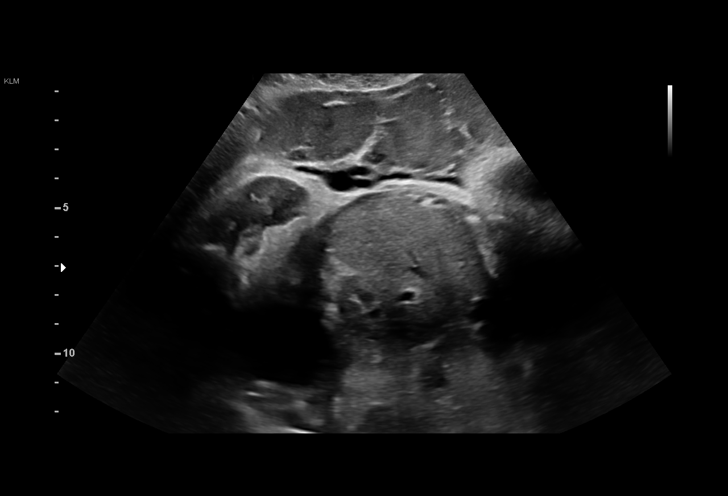
[im 28/28]
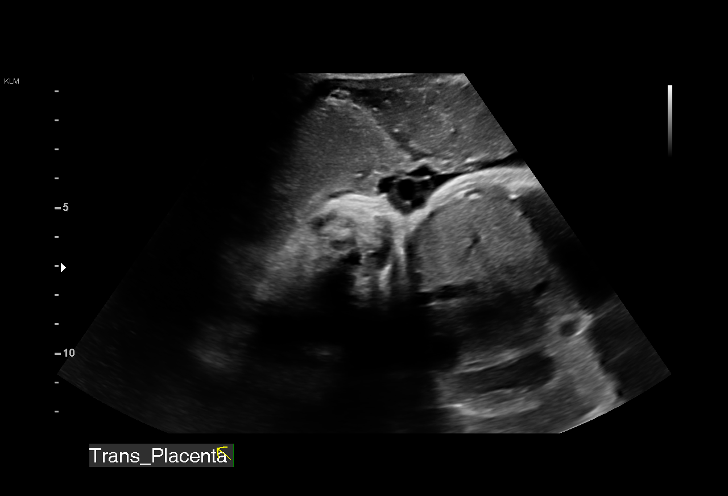

[14 of 28 positions shown; findings below may reference images not displayed]

Indications

 Medical complication of pregnancy
 (abnormal glucose test, CBGs in goal range)
 38 weeks gestation of pregnancy
Fetal Evaluation

 Num Of Fetuses:         1
 Fetal Heart Rate(bpm):  135
 Cardiac Activity:       Observed
 Presentation:           Cephalic
 Placenta:               Anterior
 P. Cord Insertion:      Previously Visualized

 Amniotic Fluid
 AFI FV:      Within normal limits

 AFI Sum(cm)     %Tile       Largest Pocket(cm)
 10.8            32

 RUQ(cm)       RLQ(cm)       LUQ(cm)        LLQ(cm)

Biometry

 BPD:      89.3  mm     G. Age:  36w 1d         23  %    CI:        79.39   %    70 - 86
                                                         FL/HC:      22.0   %    20.9 -
 HC:      316.8  mm     G. Age:  35w 4d        1.6  %    HC/AC:      0.91        0.92 -
 AC:      348.4  mm     G. Age:  38w 5d         85  %    FL/BPD:     78.1   %    71 - 87
 FL:       69.7  mm     G. Age:  35w 5d          7  %    FL/AC:      20.0   %    20 - 24

 Est. FW:    8484  gm      7 lb 1 oz     46  %
OB History

 Gravidity:    3         Term:   2
 Living:       2
Gestational Age

 LMP:           38w 0d        Date:  09/24/18                 EDD:   07/01/19
 U/S Today:     36w 4d                                        EDD:   07/11/19
 Best:          38w 0d     Det. By:  LMP  (09/24/18)          EDD:   07/01/19
Anatomy

 Cranium:               Appears normal         LVOT:                   Appears normal
 Cavum:                 Previously seen        Aortic Arch:            Previously seen
 Ventricles:            Previously seen        Ductal Arch:            Not well visualized
 Choroid Plexus:        Previously seen        Diaphragm:              Appears normal
 Cerebellum:            Previously seen        Stomach:                Appears normal, left
                                                                       sided
 Posterior Fossa:       Previously seen        Abdomen:                Previously seen
 Nuchal Fold:           Not applicable (>20    Abdominal Wall:         Previously seen
                        wks GA)
 Face:                  Orbits and profile     Cord Vessels:           Previously seen
                        previously seen
 Lips:                  Previously seen        Kidneys:                Appear normal
 Palate:                Previously seen        Bladder:                Appears normal
 Thoracic:              Appears normal         Spine:                  Previously seen
 Heart:                 Appears normal         Upper Extremities:      Previously seen
                        (4CH, axis, and
                        situs)
 RVOT:                  Appears normal         Lower Extremities:      Previously seen

 Other:  Nasal bone visualized previously. Heels and 5th digit visualized
         previously.
Cervix Uterus Adnexa

 Cervix
 Not visualized (advanced GA >68wks)
Comments

 This patient was seen for a follow up growth scan.
 She was informed that the fetal growth and amniotic fluid
 level appears appropriate for her gestational age.
 Follow up as indicated.
